# Patient Record
Sex: Male | Born: 1990
Health system: Southern US, Community
[De-identification: ages and names within clinical notes are randomized; demographics above are authoritative.]

## PROBLEM LIST (undated history)

## (undated) DIAGNOSIS — W3301XA Accidental discharge of shotgun, initial encounter: Secondary | ICD-10-CM

## (undated) HISTORY — DX: Accidental discharge of shotgun, initial encounter: W33.01XA

---

## 2011-09-05 ENCOUNTER — Emergency Department (INDEPENDENT_AMBULATORY_CARE_PROVIDER_SITE_OTHER): Payer: 59

## 2011-09-05 ENCOUNTER — Emergency Department (HOSPITAL_COMMUNITY)
Admission: EM | Admit: 2011-09-05 | Discharge: 2011-09-05 | Disposition: A | Discharge status: Home / Self Care | Payer: 59 | Source: Home / Self Care | Attending: Family Medicine | Admitting: Family Medicine

## 2011-09-05 DIAGNOSIS — S93402A Sprain of unspecified ligament of left ankle, initial encounter: Secondary | ICD-10-CM

## 2011-09-05 DIAGNOSIS — S93409A Sprain of unspecified ligament of unspecified ankle, initial encounter: Secondary | ICD-10-CM

## 2011-09-05 MED ORDER — HYDROCODONE-ACETAMINOPHEN 5-325 MG PO TABS: ORAL_TABLET | ORAL | Status: AC

## 2011-09-05 MED ORDER — NAPROXEN 375 MG PO TABS: 375.0000 mg | ORAL_TABLET | Freq: Two times a day (BID) | ORAL | Status: AC

## 2011-09-05 NOTE — ED Provider Notes (Signed)
History     CSN: [id number]  Arrival date & time 09/05/11  1208   First MD Initiated Contact with Patient 09/05/11 1417      Chief Complaint  Patient presents with  . Ankle Pain    (Consider location/radiation/quality/duration/timing/severity/associated sxs/prior treatment) HPI Comments: Colin Thompson presents for evaluation of LEFT ankle pain after twisting it yesterday while playing basketball. He reports severe pain with ambulation.   Patient is a 21 y.o. male presenting with ankle pain. The history is provided by the patient.  Ankle Pain  The incident occurred yesterday. The incident occurred at home. The injury mechanism was torsion. The pain is present in the left ankle. The pain is severe. The pain has been constant since onset. Associated symptoms include inability to bear weight. The symptoms are aggravated by bearing weight, activity and palpation.    History reviewed. No pertinent past medical history.  History reviewed. No pertinent past surgical history.  No family history on file.  History  Substance Use Topics  . Smoking status: Never Smoker   . Smokeless tobacco: Not on file  . Alcohol Use: No      Review of Systems  Constitutional: Negative.   HENT: Negative.   Eyes: Negative.   Respiratory: Negative.   Cardiovascular: Negative.   Gastrointestinal: Negative.   Genitourinary: Negative.   Musculoskeletal: Positive for joint swelling, arthralgias and gait problem.  Skin: Negative.   Neurological: Negative.     Allergies  Review of patient's allergies indicates no known allergies.  Home Medications   Current Outpatient Rx  Name Route Sig Dispense Refill  . HYDROCODONE-ACETAMINOPHEN 5-325 MG PO TABS  Take one to two tablets every 4 to 6 hours as needed for pain 20 tablet 0  . NAPROXEN 375 MG PO TABS Oral Take 1 tablet (375 mg total) by mouth 2 (two) times daily. 20 tablet 0    BP 122/70  Pulse 60  Temp(Src) 98 F (36.7 C) (Oral)  Resp 16  SpO2  100%  Physical Exam  Nursing note and vitals reviewed. Constitutional: He is oriented to person, place, and time. He appears well-developed and well-nourished.  HENT:  Head: Normocephalic and atraumatic.  Eyes: EOM are normal.  Neck: Normal range of motion.  Pulmonary/Chest: Effort normal.  Musculoskeletal: Normal range of motion.       Left ankle: He exhibits swelling. tenderness. Lateral malleolus, AITFL, CF ligament and posterior TFL tenderness found. No medial malleolus, no head of 5th metatarsal and no proximal fibula tenderness found.       Feet:  Neurological: He is alert and oriented to person, place, and time.  Skin: Skin is warm and dry.  Psychiatric: His behavior is normal.    ED Course  Procedures (including critical care time)  Labs Reviewed - No data to display Dg Ankle Complete Left  09/05/2011  *RADIOLOGY REPORT*  Clinical Data: Left ankle injury, pain.  LEFT ANKLE COMPLETE - 3+ VIEW  Comparison: None  Findings: Lateral soft tissue swelling.  No underlying bony abnormality.  No fracture, subluxation or dislocation.  IMPRESSION: Lateral soft tissue swelling.  No fracture.  Original Report Authenticated By: Cyndie Chime, M.D.     1. Left ankle sprain       MDM  LEFT ankle xray: reviewed by radiologist and myself; no acute findings; no fracture        Richardo Priest, MD 09/05/11 1453

## 2011-09-05 NOTE — ED Notes (Signed)
Pt states he was playing basketball yesterday, rolled his lt ankle and fell.  C/o pain, swelling and bruising to lateral aspect of lt ankle.  Pain is worse with weight bearing.

## 2013-05-29 ENCOUNTER — Encounter (HOSPITAL_COMMUNITY): Payer: Self-pay | Admitting: *Deleted

## 2013-05-29 ENCOUNTER — Emergency Department (HOSPITAL_COMMUNITY)
Admission: EM | Admit: 2013-05-29 | Discharge: 2013-05-29 | Disposition: A | Discharge status: Home / Self Care | Payer: 59 | Attending: Emergency Medicine | Admitting: Emergency Medicine

## 2013-05-29 DIAGNOSIS — Z711 Person with feared health complaint in whom no diagnosis is made: Secondary | ICD-10-CM

## 2013-05-29 DIAGNOSIS — R109 Unspecified abdominal pain: Secondary | ICD-10-CM | POA: Insufficient documentation

## 2013-05-29 DIAGNOSIS — Z202 Contact with and (suspected) exposure to infections with a predominantly sexual mode of transmission: Secondary | ICD-10-CM | POA: Insufficient documentation

## 2013-05-29 MED ORDER — LIDOCAINE HCL (PF) 1 % IJ SOLN
INTRAMUSCULAR | Status: AC
  Administered 2013-05-29: 2 mL
  Filled 2013-05-29: qty 5

## 2013-05-29 MED ORDER — CEFTRIAXONE SODIUM 1 G IJ SOLR
1.0000 g | Freq: Once | INTRAMUSCULAR | Status: AC
  Administered 2013-05-29: 1 g via INTRAMUSCULAR
  Filled 2013-05-29: qty 10

## 2013-05-29 MED ORDER — AZITHROMYCIN 250 MG PO TABS
1000.0000 mg | ORAL_TABLET | Freq: Once | ORAL | Status: AC
  Administered 2013-05-29: 1000 mg via ORAL
  Filled 2013-05-29: qty 4

## 2013-05-29 NOTE — ED Notes (Signed)
Pt was told by girlfriend that she has chlamydia. Denies any s/s.
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
## 2013-05-29 NOTE — ED Provider Notes (Signed)
Medical screening examination/treatment/procedure(s) were performed by non-physician practitioner and as supervising physician I was immediately available for consultation/collaboration.    Candyce Churn, MD 05/29/13 [phone]

## 2013-05-30 LAB — GC/CHLAMYDIA PROBE AMP: CT Probe RNA: POSITIVE — AB

## 2013-05-31 ENCOUNTER — Telehealth (HOSPITAL_COMMUNITY): Payer: Self-pay | Admitting: Emergency Medicine

## 2013-05-31 NOTE — ED Notes (Signed)
Patient has +Chlamydia. 

## 2013-05-31 NOTE — ED Notes (Signed)
+  chlamydia. Patient treated with Rocephin and Zithromax. [employer] faxed.

## 2013-06-01 ENCOUNTER — Telehealth (HOSPITAL_COMMUNITY): Payer: Self-pay | Admitting: Emergency Medicine

## 2013-06-04 NOTE — ED Notes (Signed)
Patient informed of positive results after id'd x 2 and informed of need to notify partner to be treated. 

## 2013-08-11 ENCOUNTER — Encounter (HOSPITAL_COMMUNITY)
Admission: EM | Disposition: A | Discharge status: Home / Self Care | Payer: Self-pay | Source: Home / Self Care | Attending: Vascular Surgery

## 2013-08-11 ENCOUNTER — Encounter (HOSPITAL_COMMUNITY): Payer: 59 | Admitting: Anesthesiology

## 2013-08-11 ENCOUNTER — Emergency Department (HOSPITAL_COMMUNITY): Payer: 59

## 2013-08-11 ENCOUNTER — Emergency Department (HOSPITAL_COMMUNITY): Payer: 59 | Admitting: Anesthesiology

## 2013-08-11 ENCOUNTER — Inpatient Hospital Stay (HOSPITAL_COMMUNITY)
Admission: EM | Admit: 2013-08-11 | Discharge: 2013-08-14 | DRG: 908 | Disposition: A | Discharge status: Home / Self Care | Payer: 59 | Attending: Vascular Surgery | Admitting: Vascular Surgery

## 2013-08-11 ENCOUNTER — Encounter (HOSPITAL_COMMUNITY): Payer: Self-pay | Admitting: Emergency Medicine

## 2013-08-11 DIAGNOSIS — Y249XXA Unspecified firearm discharge, undetermined intent, initial encounter: Secondary | ICD-10-CM

## 2013-08-11 DIAGNOSIS — S75009A Unspecified injury of femoral artery, unspecified leg, initial encounter: Secondary | ICD-10-CM

## 2013-08-11 DIAGNOSIS — W3400XA Accidental discharge from unspecified firearms or gun, initial encounter: Secondary | ICD-10-CM

## 2013-08-11 DIAGNOSIS — D72829 Elevated white blood cell count, unspecified: Secondary | ICD-10-CM | POA: Diagnosis not present

## 2013-08-11 DIAGNOSIS — D62 Acute posthemorrhagic anemia: Secondary | ICD-10-CM | POA: Diagnosis not present

## 2013-08-11 DIAGNOSIS — S85009A Unspecified injury of popliteal artery, unspecified leg, initial encounter: Principal | ICD-10-CM | POA: Diagnosis present

## 2013-08-11 DIAGNOSIS — Y92410 Unspecified street and highway as the place of occurrence of the external cause: Secondary | ICD-10-CM

## 2013-08-11 DIAGNOSIS — F17200 Nicotine dependence, unspecified, uncomplicated: Secondary | ICD-10-CM | POA: Diagnosis present

## 2013-08-11 DIAGNOSIS — I743 Embolism and thrombosis of arteries of the lower extremities: Secondary | ICD-10-CM | POA: Diagnosis present

## 2013-08-11 DIAGNOSIS — T79A29A Traumatic compartment syndrome of unspecified lower extremity, initial encounter: Secondary | ICD-10-CM | POA: Diagnosis present

## 2013-08-11 DIAGNOSIS — F1210 Cannabis abuse, uncomplicated: Secondary | ICD-10-CM | POA: Diagnosis present

## 2013-08-11 HISTORY — PX: FEMORAL-POPLITEAL BYPASS GRAFT: SHX937

## 2013-08-11 HISTORY — PX: INTRAOPERATIVE ARTERIOGRAM: SHX5157

## 2013-08-11 LAB — CBC WITH DIFFERENTIAL/PLATELET
Basophils Absolute: 0 10*3/uL (ref 0.0–0.1)
Basophils Relative: 1 % (ref 0–1)
Eosinophils Relative: 2 % (ref 0–5)
HCT: 42.9 % (ref 39.0–52.0)
Hemoglobin: 15 g/dL (ref 13.0–17.0)
MCHC: 35 g/dL (ref 30.0–36.0)
MCV: 90.1 fL (ref 78.0–100.0)
Monocytes Absolute: 0.5 10*3/uL (ref 0.1–1.0)
Monocytes Relative: 6 % (ref 3–12)
Neutro Abs: 3.8 10*3/uL (ref 1.7–7.7)
RDW: 13.3 % (ref 11.5–15.5)

## 2013-08-11 LAB — POCT I-STAT, CHEM 8
BUN: 12 mg/dL (ref 6–23)
Calcium, Ion: 1.2 mmol/L (ref 1.12–1.23)
Creatinine, Ser: 1.2 mg/dL (ref 0.50–1.35)
Hemoglobin: 16.3 g/dL (ref 13.0–17.0)
Sodium: 142 mEq/L (ref 135–145)
TCO2: 27 mmol/L (ref 0–100)

## 2013-08-11 LAB — BASIC METABOLIC PANEL
BUN: 11 mg/dL (ref 6–23)
CO2: 26 mEq/L (ref 19–32)
Calcium: 9 mg/dL (ref 8.4–10.5)
Chloride: 103 mEq/L (ref 96–112)
Creatinine, Ser: 1.1 mg/dL (ref 0.50–1.35)
GFR calc Af Amer: >90 mL/min (ref >90)

## 2013-08-11 SURGERY — BYPASS GRAFT FEMORAL-POPLITEAL ARTERY
Anesthesia: General | Site: Leg Upper | Laterality: Left

## 2013-08-11 MED ORDER — GLYCOPYRROLATE 0.2 MG/ML IJ SOLN
INTRAMUSCULAR | Status: DC | PRN
  Administered 2013-08-11: .6 mg via INTRAVENOUS

## 2013-08-11 MED ORDER — MORPHINE SULFATE (PF) 1 MG/ML IV SOLN
INTRAVENOUS | Status: AC
  Administered 2013-08-12: 1.5 mg
  Filled 2013-08-11: qty 25

## 2013-08-11 MED ORDER — HEPARIN SODIUM (PORCINE) 1000 UNIT/ML IJ SOLN
INTRAMUSCULAR | Status: DC | PRN
  Administered 2013-08-11: 10000 [IU] via INTRAVENOUS
  Administered 2013-08-11: 5000 [IU] via INTRAVENOUS

## 2013-08-11 MED ORDER — ALBUMIN HUMAN 5 % IV SOLN
INTRAVENOUS | Status: DC | PRN
  Administered 2013-08-11: 20:00:00 via INTRAVENOUS

## 2013-08-11 MED ORDER — DEXAMETHASONE SODIUM PHOSPHATE 4 MG/ML IJ SOLN
INTRAMUSCULAR | Status: DC | PRN
  Administered 2013-08-11: 4 mg via INTRAVENOUS

## 2013-08-11 MED ORDER — CEFAZOLIN SODIUM 1-5 GM-% IV SOLN
1.0000 g | Freq: Once | INTRAVENOUS | Status: AC
  Administered 2013-08-11: 1 g via INTRAVENOUS
  Filled 2013-08-11: qty 50

## 2013-08-11 MED ORDER — HYDROMORPHONE HCL PF 1 MG/ML IJ SOLN: 0.2500 mg | INTRAMUSCULAR | Status: DC | PRN

## 2013-08-11 MED ORDER — PROTAMINE SULFATE 10 MG/ML IV SOLN
INTRAVENOUS | Status: DC | PRN
  Administered 2013-08-11: 70 mg via INTRAVENOUS

## 2013-08-11 MED ORDER — PROPOFOL 10 MG/ML IV BOLUS
INTRAVENOUS | Status: DC | PRN
  Administered 2013-08-11: 190 mg via INTRAVENOUS

## 2013-08-11 MED ORDER — ARTIFICIAL TEARS OP OINT
TOPICAL_OINTMENT | OPHTHALMIC | Status: DC | PRN
  Administered 2013-08-11: 1 via OPHTHALMIC

## 2013-08-11 MED ORDER — ONDANSETRON HCL 4 MG/2ML IJ SOLN: 4.0000 mg | Freq: Four times a day (QID) | INTRAMUSCULAR | Status: DC | PRN

## 2013-08-11 MED ORDER — FENTANYL CITRATE 0.05 MG/ML IJ SOLN
INTRAMUSCULAR | Status: DC | PRN
  Administered 2013-08-11 (×12): 50 ug via INTRAVENOUS

## 2013-08-11 MED ORDER — FENTANYL CITRATE 0.05 MG/ML IJ SOLN
50.0000 ug | INTRAMUSCULAR | Status: AC | PRN
  Administered 2013-08-11 (×2): 50 ug via INTRAVENOUS
  Filled 2013-08-11 (×2): qty 2

## 2013-08-11 MED ORDER — OXYCODONE-ACETAMINOPHEN 5-325 MG PO TABS: ORAL_TABLET | ORAL | Status: DC

## 2013-08-11 MED ORDER — ONDANSETRON HCL 4 MG/2ML IJ SOLN
INTRAMUSCULAR | Status: DC | PRN
  Administered 2013-08-11: 4 mg via INTRAVENOUS

## 2013-08-11 MED ORDER — ROCURONIUM BROMIDE 100 MG/10ML IV SOLN
INTRAVENOUS | Status: DC | PRN
  Administered 2013-08-11: 20 mg via INTRAVENOUS
  Administered 2013-08-11: 50 mg via INTRAVENOUS
  Administered 2013-08-11: 20 mg via INTRAVENOUS
  Administered 2013-08-11 (×2): 10 mg via INTRAVENOUS
  Administered 2013-08-11: 20 mg via INTRAVENOUS

## 2013-08-11 MED ORDER — NEOSTIGMINE METHYLSULFATE 1 MG/ML IJ SOLN
INTRAMUSCULAR | Status: DC | PRN
  Administered 2013-08-11: 4 mg via INTRAVENOUS

## 2013-08-11 MED ORDER — LIDOCAINE HCL (CARDIAC) 20 MG/ML IV SOLN
INTRAVENOUS | Status: DC | PRN
  Administered 2013-08-11: 60 mg via INTRAVENOUS

## 2013-08-11 MED ORDER — HYDROMORPHONE HCL PF 1 MG/ML IJ SOLN
1.0000 mg | Freq: Once | INTRAMUSCULAR | Status: AC
  Administered 2013-08-11: 1 mg via INTRAVENOUS
  Filled 2013-08-11: qty 1

## 2013-08-11 MED ORDER — OXYCODONE HCL 5 MG/5ML PO SOLN: 5.0000 mg | Freq: Once | ORAL | Status: AC | PRN

## 2013-08-11 MED ORDER — LACTATED RINGERS IV SOLN
INTRAVENOUS | Status: DC | PRN
  Administered 2013-08-11 (×3): via INTRAVENOUS

## 2013-08-11 MED ORDER — CEFAZOLIN SODIUM-DEXTROSE 2-3 GM-% IV SOLR
INTRAVENOUS | Status: AC
  Filled 2013-08-11: qty 50

## 2013-08-11 MED ORDER — OXYCODONE HCL 5 MG PO TABS: 5.0000 mg | ORAL_TABLET | Freq: Once | ORAL | Status: AC | PRN

## 2013-08-11 MED ORDER — MIDAZOLAM HCL 5 MG/5ML IJ SOLN
INTRAMUSCULAR | Status: DC | PRN
  Administered 2013-08-11: 1 mg via INTRAVENOUS

## 2013-08-11 MED ORDER — IOHEXOL 300 MG/ML  SOLN
INTRAMUSCULAR | Status: DC | PRN
  Administered 2013-08-11: 50 mL via INTRA_ARTERIAL

## 2013-08-11 MED ORDER — LACTATED RINGERS IV SOLN
INTRAVENOUS | Status: DC
  Administered 2013-08-11: 50 mL/h via INTRAVENOUS
  Administered 2013-08-13: 15:00:00 via INTRAVENOUS

## 2013-08-11 MED ORDER — IOHEXOL 300 MG/ML  SOLN
INTRAMUSCULAR | Status: DC | PRN
  Administered 2013-08-11: 25 mL via INTRA_ARTERIAL

## 2013-08-11 MED ORDER — MORPHINE SULFATE (PF) 1 MG/ML IV SOLN
INTRAVENOUS | Status: DC
  Administered 2013-08-11: via INTRAVENOUS
  Administered 2013-08-12: 3 mg via INTRAVENOUS
  Administered 2013-08-12: 7.5 mg via INTRAVENOUS
  Administered 2013-08-12: 12 mg via INTRAVENOUS
  Administered 2013-08-12: 6 mg via INTRAVENOUS
  Administered 2013-08-13: 4.5 mg via INTRAVENOUS
  Administered 2013-08-13: 6 mg via INTRAVENOUS
  Administered 2013-08-13: 9 mg via INTRAVENOUS
  Administered 2013-08-13: 0.843 mg via INTRAVENOUS
  Administered 2013-08-13: 7.5 mg via INTRAVENOUS
  Filled 2013-08-11 (×2): qty 25

## 2013-08-11 MED ORDER — SODIUM CHLORIDE 0.9 % IV SOLN
INTRAVENOUS | Status: DC
  Administered 2013-08-11: 14:00:00 via INTRAVENOUS

## 2013-08-11 MED ORDER — IOHEXOL 350 MG/ML SOLN
100.0000 mL | Freq: Once | INTRAVENOUS | Status: AC | PRN
  Administered 2013-08-11: 100 mL via INTRAVENOUS

## 2013-08-11 MED ORDER — SODIUM CHLORIDE 0.9 % IR SOLN
Status: DC | PRN
  Administered 2013-08-11: 20:00:00

## 2013-08-11 MED ORDER — THROMBIN 20000 UNITS EX SOLR
CUTANEOUS | Status: AC
  Filled 2013-08-11: qty 20000

## 2013-08-11 MED ORDER — CEFAZOLIN SODIUM-DEXTROSE 2-3 GM-% IV SOLR
INTRAVENOUS | Status: DC | PRN
  Administered 2013-08-11: 2 g via INTRAVENOUS

## 2013-08-11 MED ORDER — PAPAVERINE HCL 30 MG/ML IJ SOLN
INTRAMUSCULAR | Status: AC
  Filled 2013-08-11: qty 2

## 2013-08-11 MED ORDER — SUCCINYLCHOLINE CHLORIDE 20 MG/ML IJ SOLN
INTRAMUSCULAR | Status: DC | PRN
  Administered 2013-08-11: 120 mg via INTRAVENOUS

## 2013-08-11 MED ORDER — 0.9 % SODIUM CHLORIDE (POUR BTL) OPTIME
TOPICAL | Status: DC | PRN
  Administered 2013-08-11: 3000 mL

## 2013-08-11 MED ORDER — CEPHALEXIN 500 MG PO CAPS: 500.0000 mg | ORAL_CAPSULE | Freq: Four times a day (QID) | ORAL | Status: DC

## 2013-08-11 MED ORDER — ONDANSETRON HCL 4 MG/2ML IJ SOLN
4.0000 mg | INTRAMUSCULAR | Status: DC | PRN
  Administered 2013-08-11: 4 mg via INTRAVENOUS
  Filled 2013-08-11: qty 2

## 2013-08-11 SURGICAL SUPPLY — 73 items
BANDAGE ELASTIC 4 VELCRO ST LF (GAUZE/BANDAGES/DRESSINGS) ×4 IMPLANT
BANDAGE ESMARK 6X9 LF (GAUZE/BANDAGES/DRESSINGS) IMPLANT
BANDAGE GAUZE ELAST BULKY 4 IN (GAUZE/BANDAGES/DRESSINGS) ×4 IMPLANT
BNDG ESMARK 6X9 LF (GAUZE/BANDAGES/DRESSINGS)
CANISTER SUCTION 2500CC (MISCELLANEOUS) ×2 IMPLANT
CANNULA VESSEL 3MM 2 BLNT TIP (CANNULA) ×2 IMPLANT
CATH EMB 4FR 80CM (CATHETERS) ×2 IMPLANT
CLIP TI MEDIUM 24 (CLIP) ×2 IMPLANT
CLIP TI WIDE RED SMALL 24 (CLIP) ×2 IMPLANT
COVER SURGICAL LIGHT HANDLE (MISCELLANEOUS) ×2 IMPLANT
CUFF TOURNIQUET SINGLE 24IN (TOURNIQUET CUFF) IMPLANT
CUFF TOURNIQUET SINGLE 34IN LL (TOURNIQUET CUFF) IMPLANT
CUFF TOURNIQUET SINGLE 44IN (TOURNIQUET CUFF) IMPLANT
DERMABOND ADVANCED (GAUZE/BANDAGES/DRESSINGS)
DERMABOND ADVANCED .7 DNX12 (GAUZE/BANDAGES/DRESSINGS) IMPLANT
DRAIN HEMOVAC 1/8 X 5 (WOUND CARE) ×2 IMPLANT
DRAIN SNY WOU (WOUND CARE) IMPLANT
DRAPE WARM FLUID 44X44 (DRAPE) ×2 IMPLANT
DRAPE X-RAY CASS 24X20 (DRAPES) ×4 IMPLANT
DRSG COVADERM 4X10 (GAUZE/BANDAGES/DRESSINGS) ×2 IMPLANT
DRSG COVADERM 4X6 (GAUZE/BANDAGES/DRESSINGS) ×2 IMPLANT
ELECT REM PT RETURN 9FT ADLT (ELECTROSURGICAL) ×2
ELECTRODE REM PT RTRN 9FT ADLT (ELECTROSURGICAL) ×1 IMPLANT
EVACUATOR SILICONE 100CC (DRAIN) ×2 IMPLANT
GLOVE BIO SURGEON STRL SZ7.5 (GLOVE) ×2 IMPLANT
GLOVE BIOGEL PI IND STRL 6.5 (GLOVE) ×2 IMPLANT
GLOVE BIOGEL PI IND STRL 7.0 (GLOVE) ×2 IMPLANT
GLOVE BIOGEL PI IND STRL 7.5 (GLOVE) ×2 IMPLANT
GLOVE BIOGEL PI INDICATOR 6.5 (GLOVE) ×2
GLOVE BIOGEL PI INDICATOR 7.0 (GLOVE) ×2
GLOVE BIOGEL PI INDICATOR 7.5 (GLOVE) ×2
GLOVE SS BIOGEL STRL SZ 7 (GLOVE) ×2 IMPLANT
GLOVE SUPERSENSE BIOGEL SZ 7 (GLOVE) ×2
GLOVE SURG SS PI 7.5 STRL IVOR (GLOVE) ×2 IMPLANT
GOWN PREVENTION PLUS XLARGE (GOWN DISPOSABLE) ×6 IMPLANT
GOWN STRL NON-REIN LRG LVL3 (GOWN DISPOSABLE) ×6 IMPLANT
KIT BASIN OR (CUSTOM PROCEDURE TRAY) ×2 IMPLANT
KIT ROOM TURNOVER OR (KITS) ×2 IMPLANT
NS IRRIG 1000ML POUR BTL (IV SOLUTION) ×6 IMPLANT
PACK PERIPHERAL VASCULAR (CUSTOM PROCEDURE TRAY) ×2 IMPLANT
PAD ABD 8X10 STRL (GAUZE/BANDAGES/DRESSINGS) ×4 IMPLANT
PAD ARMBOARD 7.5X6 YLW CONV (MISCELLANEOUS) ×4 IMPLANT
PADDING CAST COTTON 6X4 STRL (CAST SUPPLIES) IMPLANT
SET COLLECT BLD 21X3/4 12 (NEEDLE) ×4 IMPLANT
SET MICROPUNCTURE 5F STIFF (MISCELLANEOUS) ×2 IMPLANT
SPONGE GAUZE 4X4 12PLY (GAUZE/BANDAGES/DRESSINGS) ×8 IMPLANT
SPONGE LAP 18X18 X RAY DECT (DISPOSABLE) ×2 IMPLANT
SPONGE SURGIFOAM ABS GEL 100 (HEMOSTASIS) IMPLANT
STAPLER VISISTAT 35W (STAPLE) ×4 IMPLANT
STOPCOCK 4 WAY LG BORE MALE ST (IV SETS) ×2 IMPLANT
SUT ETHILON 3 0 PS 1 (SUTURE) ×2 IMPLANT
SUT PROLENE 5 0 C 1 24 (SUTURE) IMPLANT
SUT PROLENE 6 0 CC (SUTURE) ×8 IMPLANT
SUT PROLENE 7 0 BV 1 (SUTURE) ×2 IMPLANT
SUT PROLENE 7 0 BV1 MDA (SUTURE) IMPLANT
SUT SILK 2 0 SH (SUTURE) ×2 IMPLANT
SUT SILK 3 0 (SUTURE) ×1
SUT SILK 3-0 18XBRD TIE 12 (SUTURE) ×1 IMPLANT
SUT SILK 4 0 (SUTURE) ×1
SUT SILK 4-0 18XBRD TIE 12 (SUTURE) ×1 IMPLANT
SUT VIC AB 2-0 CTX 36 (SUTURE) ×2 IMPLANT
SUT VIC AB 3-0 SH 27 (SUTURE) ×2
SUT VIC AB 3-0 SH 27X BRD (SUTURE) ×2 IMPLANT
SUT VIC AB 4-0 PS2 27 (SUTURE) ×4 IMPLANT
SYR 3ML LL SCALE MARK (SYRINGE) ×2 IMPLANT
TAPE CLOTH SURG 4X10 WHT LF (GAUZE/BANDAGES/DRESSINGS) ×4 IMPLANT
TAPE UMBILICAL COTTON 1/8X30 (MISCELLANEOUS) IMPLANT
TOWEL OR 17X24 6PK STRL BLUE (TOWEL DISPOSABLE) ×6 IMPLANT
TOWEL OR 17X26 10 PK STRL BLUE (TOWEL DISPOSABLE) ×4 IMPLANT
TRAY FOLEY CATH 16FRSI W/METER (SET/KITS/TRAYS/PACK) ×2 IMPLANT
TUBING EXTENTION W/L.L. (IV SETS) ×2 IMPLANT
UNDERPAD 30X30 INCONTINENT (UNDERPADS AND DIAPERS) ×2 IMPLANT
WATER STERILE IRR 1000ML POUR (IV SOLUTION) ×2 IMPLANT
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
## 2013-08-11 NOTE — Preoperative (Addendum)
Beta Blockers   Reason not to administer Beta Blockers:Not Applicable 
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
## 2013-08-11 NOTE — Anesthesia Postprocedure Evaluation (Signed)
Anesthesia Post Note  Patient: Colin Thompson  Procedure(s) Performed: Procedure(s) (LRB): Repair of Left Popliteal Artery using Left Above Knee Popliteal to Above Knee Popliteal Bypass Graft with interpositional contralateral reverse vein graft; fourth compartment fasciotomy (Left) INTRA OPERATIVE ARTERIOGRAM (Left)  Anesthesia type: General  Patient location: PACU  Post pain: Pain level controlled and Adequate analgesia  Post assessment: Post-op Vital signs reviewed, Patient's Cardiovascular Status Stable, Respiratory Function Stable, Patent Airway and Pain level controlled  Last Vitals:  Filed Vitals:   08/11/13 2330  BP:   Pulse:   Temp:   Resp: 14    Post vital signs: Reviewed and stable  Level of consciousness: awake, alert  and oriented  Complications: No apparent anesthesia complications

## 2013-08-11 NOTE — ED Notes (Signed)
RN transported pt over to radiology.

## 2013-08-11 NOTE — Transfer of Care (Signed)
Immediate Anesthesia Transfer of Care Note  Patient: Jacorian Xxxparker  Procedure(s) Performed: Procedure(s): Repair of Left Popliteal Artery using Left Above Knee Popliteal to Above Knee Popliteal Bypass Graft with interpositional contralateral reverse vein graft; fourth compartment fasciotomy (Left) INTRA OPERATIVE ARTERIOGRAM (Left)  Patient Location: PACU  Anesthesia Type:General  Level of Consciousness: awake, alert , oriented and patient cooperative  Airway & Oxygen Therapy: Patient Spontanous Breathing and Patient connected to nasal cannula oxygen  Post-op Assessment: Report given to PACU RN and Post -op Vital signs reviewed and stable  Post vital signs: Reviewed and stable  Complications: No apparent anesthesia complications
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
## 2013-08-11 NOTE — Anesthesia Procedure Notes (Signed)
Procedure Name: Intubation Date/Time: 08/11/2013 7:02 PM Performed by: Julianne Rice Z Pre-anesthesia Checklist: Patient identified, Timeout performed, Emergency Drugs available, Suction available and Patient being monitored Patient Re-evaluated:Patient Re-evaluated prior to inductionOxygen Delivery Method: Circle system utilized Preoxygenation: Pre-oxygenation with 100% oxygen Intubation Type: IV induction, Rapid sequence and Cricoid Pressure applied Laryngoscope Size: Mac and 4 Grade View: Grade I Tube type: Oral Tube size: 8.0 mm Number of attempts: 1 Airway Equipment and Method: Stylet Placement Confirmation: ETT inserted through vocal cords under direct vision,  breath sounds checked- equal and bilateral and positive ETCO2 Secured at: 23 cm Tube secured with: Tape Dental Injury: Teeth and Oropharynx as per pre-operative assessment

## 2013-08-11 NOTE — ED Notes (Signed)
Phlebotomy at bedside to draw PT/ INR then will take pt up to OR.
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
## 2013-08-11 NOTE — ED Notes (Addendum)
Pt signed consent form. Undressed and in gown. All belongings given to girlfriend.

## 2013-08-11 NOTE — ED Notes (Signed)
Pt reports tetanus shot is up to date.

## 2013-08-12 ENCOUNTER — Encounter (HOSPITAL_COMMUNITY): Payer: Self-pay | Admitting: Vascular Surgery

## 2013-08-12 ENCOUNTER — Other Ambulatory Visit: Payer: Self-pay | Admitting: *Deleted

## 2013-08-12 LAB — GLUCOSE, CAPILLARY: Glucose-Capillary: 123 mg/dL — ABNORMAL HIGH (ref 70–99)

## 2013-08-12 LAB — CBC
HCT: 33.7 % — ABNORMAL LOW (ref 39.0–52.0)
MCH: 31.3 pg (ref 26.0–34.0)
MCV: 90.8 fL (ref 78.0–100.0)
RDW: 13.2 % (ref 11.5–15.5)
WBC: 15 10*3/uL — ABNORMAL HIGH (ref 4.0–10.5)

## 2013-08-12 LAB — BASIC METABOLIC PANEL
BUN: 9 mg/dL (ref 6–23)
CO2: 24 mEq/L (ref 19–32)
Calcium: 8.7 mg/dL (ref 8.4–10.5)
Chloride: 101 mEq/L (ref 96–112)
Creatinine, Ser: 0.97 mg/dL (ref 0.50–1.35)
GFR calc Af Amer: >90 mL/min (ref >90)
GFR calc non Af Amer: >90 mL/min (ref >90)

## 2013-08-12 MED ORDER — DEXTROSE 5 % IV SOLN
1.5000 g | Freq: Two times a day (BID) | INTRAVENOUS | Status: AC
  Administered 2013-08-12 (×2): 1.5 g via INTRAVENOUS
  Filled 2013-08-12 (×2): qty 1.5

## 2013-08-12 MED ORDER — SODIUM CHLORIDE 0.9 % IJ SOLN: 9.0000 mL | INTRAMUSCULAR | Status: DC | PRN

## 2013-08-12 MED ORDER — INFLUENZA VAC SPLIT QUAD 0.5 ML IM SUSP
0.5000 mL | INTRAMUSCULAR | Status: DC
  Filled 2013-08-12: qty 0.5

## 2013-08-12 MED ORDER — FLEET ENEMA 7-19 GM/118ML RE ENEM: 1.0000 | ENEMA | Freq: Once | RECTAL | Status: AC | PRN

## 2013-08-12 MED ORDER — ALUM & MAG HYDROXIDE-SIMETH 200-200-20 MG/5ML PO SUSP: 15.0000 mL | ORAL | Status: DC | PRN

## 2013-08-12 MED ORDER — LABETALOL HCL 5 MG/ML IV SOLN: 10.0000 mg | INTRAVENOUS | Status: DC | PRN

## 2013-08-12 MED ORDER — MAGNESIUM SULFATE 40 MG/ML IJ SOLN
2.0000 g | Freq: Once | INTRAMUSCULAR | Status: AC | PRN
  Filled 2013-08-12: qty 50

## 2013-08-12 MED ORDER — ACETAMINOPHEN 650 MG RE SUPP: 325.0000 mg | RECTAL | Status: DC | PRN

## 2013-08-12 MED ORDER — METOPROLOL TARTRATE 1 MG/ML IV SOLN: 2.0000 mg | INTRAVENOUS | Status: DC | PRN

## 2013-08-12 MED ORDER — PHENOL 1.4 % MT LIQD: 1.0000 | OROMUCOSAL | Status: DC | PRN

## 2013-08-12 MED ORDER — GUAIFENESIN-DM 100-10 MG/5ML PO SYRP: 15.0000 mL | ORAL_SOLUTION | ORAL | Status: DC | PRN

## 2013-08-12 MED ORDER — DOPAMINE-DEXTROSE 3.2-5 MG/ML-% IV SOLN: 3.0000 ug/kg/min | INTRAVENOUS | Status: DC

## 2013-08-12 MED ORDER — DIPHENHYDRAMINE HCL 50 MG/ML IJ SOLN: 12.5000 mg | Freq: Four times a day (QID) | INTRAMUSCULAR | Status: DC | PRN

## 2013-08-12 MED ORDER — ONDANSETRON HCL 4 MG/2ML IJ SOLN: 4.0000 mg | Freq: Four times a day (QID) | INTRAMUSCULAR | Status: DC | PRN

## 2013-08-12 MED ORDER — ACETAMINOPHEN 325 MG PO TABS: 325.0000 mg | ORAL_TABLET | ORAL | Status: DC | PRN

## 2013-08-12 MED ORDER — SODIUM CHLORIDE 0.9 % IV SOLN
INTRAVENOUS | Status: DC
  Administered 2013-08-12: 20 mL/h via INTRAVENOUS
  Administered 2013-08-12: 01:00:00 via INTRAVENOUS

## 2013-08-12 MED ORDER — DOCUSATE SODIUM 100 MG PO CAPS
100.0000 mg | ORAL_CAPSULE | Freq: Every day | ORAL | Status: DC
  Administered 2013-08-12 – 2013-08-14 (×3): 100 mg via ORAL
  Filled 2013-08-12 (×3): qty 1

## 2013-08-12 MED ORDER — SENNOSIDES-DOCUSATE SODIUM 8.6-50 MG PO TABS
1.0000 | ORAL_TABLET | Freq: Every evening | ORAL | Status: DC | PRN
  Filled 2013-08-12: qty 1

## 2013-08-12 MED ORDER — DIPHENHYDRAMINE HCL 12.5 MG/5ML PO ELIX
12.5000 mg | ORAL_SOLUTION | Freq: Four times a day (QID) | ORAL | Status: DC | PRN
  Filled 2013-08-12: qty 5

## 2013-08-12 MED ORDER — SODIUM CHLORIDE 0.9 % IV SOLN
INTRAVENOUS | Status: DC
  Administered 2013-08-12: 20 mL/h via INTRAVENOUS

## 2013-08-12 MED ORDER — BISACODYL 10 MG RE SUPP: 10.0000 mg | Freq: Every day | RECTAL | Status: DC | PRN

## 2013-08-12 MED ORDER — DEXTROSE 5 % IV SOLN
1.5000 g | INTRAVENOUS | Status: AC
  Administered 2013-08-13: 1.5 g via INTRAVENOUS
  Filled 2013-08-12: qty 1.5

## 2013-08-12 MED ORDER — OXYCODONE-ACETAMINOPHEN 5-325 MG PO TABS: 1.0000 | ORAL_TABLET | ORAL | Status: DC | PRN

## 2013-08-12 MED ORDER — PANTOPRAZOLE SODIUM 40 MG PO TBEC
40.0000 mg | DELAYED_RELEASE_TABLET | Freq: Every day | ORAL | Status: DC
  Administered 2013-08-12 – 2013-08-14 (×3): 40 mg via ORAL
  Filled 2013-08-12 (×3): qty 1

## 2013-08-12 MED ORDER — PNEUMOCOCCAL VAC POLYVALENT 25 MCG/0.5ML IJ INJ
0.5000 mL | INJECTION | INTRAMUSCULAR | Status: DC
  Filled 2013-08-12: qty 0.5

## 2013-08-12 MED ORDER — NALOXONE HCL 0.4 MG/ML IJ SOLN: 0.4000 mg | INTRAMUSCULAR | Status: DC | PRN

## 2013-08-12 MED ORDER — POTASSIUM CHLORIDE CRYS ER 20 MEQ PO TBCR: 20.0000 meq | EXTENDED_RELEASE_TABLET | Freq: Once | ORAL | Status: AC | PRN

## 2013-08-12 MED ORDER — HYDRALAZINE HCL 20 MG/ML IJ SOLN: 10.0000 mg | INTRAMUSCULAR | Status: DC | PRN

## 2013-08-12 MED ORDER — SODIUM CHLORIDE 0.9 % IV SOLN: 500.0000 mL | Freq: Once | INTRAVENOUS | Status: AC | PRN
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
## 2013-08-12 NOTE — Progress Notes (Signed)
Patient admitted from PACU on bed by PACU RN. Oriented patient to unit and room, instructed on callbell and placed at side. Family at bedside, educated on patient's XXX status and set up password. Will continue to monitor.
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
## 2013-08-12 NOTE — Evaluation (Signed)
Physical Therapy Evaluation Patient Details Name: Colin Thompson MRN: [id number] DOB: Jan 23, 1991 Today's Date: 08/12/2013 Time: 1003-1030 PT Time Calculation (min): 27 min  PT Assessment / Plan / Recommendation History of Present Illness  Repair of left above-knee popliteal artery with contralateral reversed greater saphenous vein interposition graft, thrombectomy of left popliteal artery, 4 compartment fasciotomy  Clinical Impression  Pt with L LE pain however tolerated ambulation well. Acute PT to con't to follow for crutch and stair traning. Anticipate pt to be safe for d/c home with mother when medically stable.    PT Assessment  Patient needs continued PT services    Follow Up Recommendations  Outpatient PT;Supervision - Intermittent (when approved by MD)    Does the patient have the potential to tolerate intense rehabilitation      Barriers to Discharge        Equipment Recommendations  Crutches    Recommendations for Other Services     Frequency Min 4X/week    Precautions / Restrictions Precautions Precautions: Fall Restrictions Weight Bearing Restrictions: Yes LLE Weight Bearing: Weight bearing as tolerated   Pertinent Vitals/Pain 8/10 L pain, pt pushed pain pump button x2      Mobility  Bed Mobility Bed Mobility: Supine to Sit;Sitting - Scoot to Edge of Bed Supine to Sit: 4: Min assist;HOB elevated Sitting - Scoot to [place] of Bed: 4: Min assist Details for Bed Mobility Assistance: assist for L LE management Transfers Transfers: Sit to Stand;Stand to Sit Sit to Stand: With upper extremity assist;From bed;3: Mod assist Stand to Sit: 4: Min assist;With upper extremity assist;To chair/3-in-1 Details for Transfer Assistance: v/c's for hand placement, assist to initiate clearance from bed Ambulation/Gait Ambulation/Gait Assistance: 4: Min assist Ambulation Distance (Feet): 120 Feet Assistive device: Rolling walker Ambulation/Gait Assistance Details: max  v/c's for sequencing of walker, pt initially NWB on L LE then progressed with v/c's to approx <50% L LE WBing Gait Pattern: Step-to pattern;Decreased step length - left;Decreased stance time - left Gait velocity: slow General Gait Details: HR in 130s, RN present and aware Stairs: No    Exercises General Exercises - Lower Extremity Ankle Circles/Pumps: AROM;Left;10 reps;Supine Quad Sets: AROM;5 reps;Left;Supine   PT Diagnosis: Difficulty walking;Acute pain  PT Problem List: Decreased strength;Decreased range of motion;Decreased activity tolerance;Decreased mobility PT Treatment Interventions: DME instruction;Gait training;Stair training;Functional mobility training;Therapeutic activities;Therapeutic exercise;Balance training     PT Goals(Current goals can be found in the care plan section) Acute Rehab PT Goals Patient Stated Goal: nhome PT Goal Formulation: With patient Time For Goal Achievement: 08/19/13 Potential to Achieve Goals: Good  Visit Information  Last PT Received On: 08/12/13 Assistance Needed: +1 History of Present Illness: Repair of left above-knee popliteal artery with contralateral reversed greater saphenous vein interposition graft, thrombectomy of left popliteal artery, 4 compartment fasciotomy       Prior Functioning  Home Living Family/patient expects to be discharged to:: Private residence Living Arrangements: Parent (going home with mother) Available Help at Discharge: Family;Friend(s);Available 24 hours/day Type of Home: House Home Access: Level entry Home Layout: Two level Alternate Level Stairs-Number of Steps: 12 Alternate Level Stairs-Rails: Right Home Equipment: None Prior Function Level of Independence: Independent Communication Communication: No difficulties Dominant Hand: Right    Cognition  Cognition Arousal/Alertness: Awake/alert Behavior During Therapy: WFL for tasks assessed/performed Overall Cognitive Status: Within Functional Limits  for tasks assessed    Extremity/Trunk Assessment Upper Extremity Assessment Upper Extremity Assessment: Generalized weakness Lower Extremity Assessment Lower Extremity Assessment: LLE deficits/detail LLE Deficits /  Details: limited AROM at ankle and knee due to onset of pain, able to initiate quad set Cervical / Trunk Assessment Cervical / Trunk Assessment: Normal   Balance    End of Session PT - End of Session Equipment Utilized During Treatment: Gait belt Activity Tolerance: Patient tolerated treatment well Patient left: in chair;with call bell/phone within reach;with family/visitor present Nurse Communication: Mobility status  GP     Marcene Brawn 08/12/2013, 11:01 AM  Lewis Shock, PT, DPT Pager #: [phone] Office #: [phone]

## 2013-08-13 ENCOUNTER — Inpatient Hospital Stay (HOSPITAL_COMMUNITY): Payer: 59 | Admitting: Anesthesiology

## 2013-08-13 ENCOUNTER — Encounter (HOSPITAL_COMMUNITY): Payer: 59 | Admitting: Anesthesiology

## 2013-08-13 ENCOUNTER — Encounter (HOSPITAL_COMMUNITY)
Admission: EM | Disposition: A | Discharge status: Home / Self Care | Payer: Self-pay | Source: Home / Self Care | Attending: Vascular Surgery

## 2013-08-13 DIAGNOSIS — S75009A Unspecified injury of femoral artery, unspecified leg, initial encounter: Secondary | ICD-10-CM

## 2013-08-13 HISTORY — PX: FASCIOTOMY CLOSURE: SHX5829

## 2013-08-13 LAB — CBC
HCT: 25.4 % — ABNORMAL LOW (ref 39.0–52.0)
Platelets: 143 10*3/uL — ABNORMAL LOW (ref 150–400)
RBC: 2.79 MIL/uL — ABNORMAL LOW (ref 4.22–5.81)
RDW: 13.2 % (ref 11.5–15.5)
WBC: 10.1 10*3/uL (ref 4.0–10.5)

## 2013-08-13 SURGERY — FASCIOTOMY CLOSURE
Anesthesia: General | Site: Leg Upper | Laterality: Left

## 2013-08-13 MED ORDER — 0.9 % SODIUM CHLORIDE (POUR BTL) OPTIME
TOPICAL | Status: DC | PRN
  Administered 2013-08-13: 4000 mL

## 2013-08-13 MED ORDER — FENTANYL CITRATE 0.05 MG/ML IJ SOLN
INTRAMUSCULAR | Status: DC | PRN
  Administered 2013-08-13: 50 ug via INTRAVENOUS
  Administered 2013-08-13: 100 ug via INTRAVENOUS

## 2013-08-13 MED ORDER — HYDROMORPHONE HCL PF 1 MG/ML IJ SOLN: 0.2500 mg | INTRAMUSCULAR | Status: DC | PRN

## 2013-08-13 MED ORDER — MIDAZOLAM HCL 5 MG/5ML IJ SOLN
INTRAMUSCULAR | Status: DC | PRN
  Administered 2013-08-13: 2 mg via INTRAVENOUS

## 2013-08-13 MED ORDER — ARTIFICIAL TEARS OP OINT
TOPICAL_OINTMENT | OPHTHALMIC | Status: DC | PRN
  Administered 2013-08-13: 1 via OPHTHALMIC

## 2013-08-13 MED ORDER — ONDANSETRON HCL 4 MG/2ML IJ SOLN
INTRAMUSCULAR | Status: DC | PRN
  Administered 2013-08-13: 4 mg via INTRAVENOUS

## 2013-08-13 MED ORDER — INFLUENZA VAC SPLIT QUAD 0.5 ML IM SUSP
0.5000 mL | INTRAMUSCULAR | Status: DC
  Filled 2013-08-13: qty 0.5

## 2013-08-13 MED ORDER — PROPOFOL 10 MG/ML IV BOLUS
INTRAVENOUS | Status: DC | PRN
  Administered 2013-08-13: 50 mg via INTRAVENOUS
  Administered 2013-08-13: 150 mg via INTRAVENOUS

## 2013-08-13 MED ORDER — LIDOCAINE HCL (CARDIAC) 20 MG/ML IV SOLN
INTRAVENOUS | Status: DC | PRN
  Administered 2013-08-13: 40 mg via INTRAVENOUS

## 2013-08-13 MED ORDER — CEFUROXIME SODIUM 1.5 G IJ SOLR: 1.5000 g | Freq: Three times a day (TID) | INTRAMUSCULAR | Status: DC

## 2013-08-13 MED ORDER — DEXTROSE 5 % IV SOLN
1.5000 g | Freq: Three times a day (TID) | INTRAVENOUS | Status: DC
  Administered 2013-08-13 – 2013-08-14 (×2): 1.5 g via INTRAVENOUS
  Filled 2013-08-13 (×5): qty 1.5

## 2013-08-13 MED ORDER — OXYCODONE HCL 5 MG/5ML PO SOLN: 5.0000 mg | Freq: Once | ORAL | Status: DC | PRN

## 2013-08-13 MED ORDER — OXYCODONE HCL 5 MG PO TABS: 5.0000 mg | ORAL_TABLET | Freq: Once | ORAL | Status: DC | PRN

## 2013-08-13 MED ORDER — LACTATED RINGERS IV SOLN
INTRAVENOUS | Status: DC
  Administered 2013-08-13: 14:00:00 via INTRAVENOUS

## 2013-08-13 MED ORDER — DEXTROSE 5 % IV SOLN
INTRAVENOUS | Status: AC
  Filled 2013-08-13: qty 1.5

## 2013-08-13 MED ORDER — DEXTROSE 5 % IV SOLN
1.5000 g | INTRAVENOUS | Status: DC | PRN
  Administered 2013-08-13: 1.5 g via INTRAVENOUS

## 2013-08-13 MED ORDER — MORPHINE SULFATE 2 MG/ML IJ SOLN: 2.0000 mg | INTRAMUSCULAR | Status: DC | PRN

## 2013-08-13 SURGICAL SUPPLY — 33 items
BAG ISOLATION DRAPE 18X18 (DRAPES) ×1 IMPLANT
BANDAGE ELASTIC 4 VELCRO ST LF (GAUZE/BANDAGES/DRESSINGS) IMPLANT
BANDAGE ELASTIC 6 VELCRO ST LF (GAUZE/BANDAGES/DRESSINGS) IMPLANT
BANDAGE GAUZE ELAST BULKY 4 IN (GAUZE/BANDAGES/DRESSINGS) IMPLANT
CANISTER SUCTION 2500CC (MISCELLANEOUS) ×4 IMPLANT
COVER SURGICAL LIGHT HANDLE (MISCELLANEOUS) ×2 IMPLANT
DRAPE ISOLATION BAG 18X18 (DRAPES) ×1
DRSG COVADERM 4X10 (GAUZE/BANDAGES/DRESSINGS) ×4 IMPLANT
DRSG COVADERM 4X8 (GAUZE/BANDAGES/DRESSINGS) ×4 IMPLANT
ELECT REM PT RETURN 9FT ADLT (ELECTROSURGICAL) ×2
ELECTRODE REM PT RTRN 9FT ADLT (ELECTROSURGICAL) ×1 IMPLANT
GLOVE BIO SURGEON STRL SZ 6.5 (GLOVE) ×2 IMPLANT
GLOVE BIO SURGEON STRL SZ7.5 (GLOVE) ×2 IMPLANT
GLOVE BIOGEL PI IND STRL 6.5 (GLOVE) ×1 IMPLANT
GLOVE BIOGEL PI INDICATOR 6.5 (GLOVE) ×1
GOWN PREVENTION PLUS XLARGE (GOWN DISPOSABLE) ×2 IMPLANT
GOWN STRL NON-REIN LRG LVL3 (GOWN DISPOSABLE) ×2 IMPLANT
KIT BASIN OR (CUSTOM PROCEDURE TRAY) ×2 IMPLANT
KIT ROOM TURNOVER OR (KITS) ×2 IMPLANT
NS IRRIG 1000ML POUR BTL (IV SOLUTION) ×8 IMPLANT
PACK GENERAL/GYN (CUSTOM PROCEDURE TRAY) ×2 IMPLANT
PACK UNIVERSAL I (CUSTOM PROCEDURE TRAY) ×2 IMPLANT
PAD ARMBOARD 7.5X6 YLW CONV (MISCELLANEOUS) ×4 IMPLANT
SPONGE GAUZE 4X4 12PLY (GAUZE/BANDAGES/DRESSINGS) ×2 IMPLANT
STAPLER VISISTAT 35W (STAPLE) ×4 IMPLANT
SUT ETHILON 3 0 PS 1 (SUTURE) IMPLANT
SUT VIC AB 2-0 CTX 36 (SUTURE) ×2 IMPLANT
SUT VIC AB 3-0 SH 27 (SUTURE) ×1
SUT VIC AB 3-0 SH 27X BRD (SUTURE) ×1 IMPLANT
SUT VICRYL 4-0 PS2 18IN ABS (SUTURE) ×2 IMPLANT
TOWEL OR 17X24 6PK STRL BLUE (TOWEL DISPOSABLE) ×2 IMPLANT
TOWEL OR 17X26 10 PK STRL BLUE (TOWEL DISPOSABLE) ×2 IMPLANT
WATER STERILE IRR 1000ML POUR (IV SOLUTION) ×2 IMPLANT

## 2013-08-13 NOTE — Anesthesia Procedure Notes (Signed)
Procedure Name: LMA Insertion Date/Time: 08/13/2013 3:04 PM Performed by: Jefm Miles E Pre-anesthesia Checklist: Patient identified, Emergency Drugs available, Suction available and Patient being monitored Patient Re-evaluated:Patient Re-evaluated prior to inductionOxygen Delivery Method: Circle system utilized Preoxygenation: Pre-oxygenation with 100% oxygen Intubation Type: IV induction Ventilation: Mask ventilation without difficulty LMA: LMA inserted LMA Size: 4.0 Number of attempts: 1 Placement Confirmation: positive ETCO2 and breath sounds checked- equal and bilateral Tube secured with: Tape Dental Injury: Teeth and Oropharynx as per pre-operative assessment

## 2013-08-13 NOTE — Progress Notes (Signed)
Physical Therapy Treatment Patient Details Name: Colin Thompson MRN: [id number] DOB: 03-04-1991 Today's Date: 08/13/2013 Time: 4098-1191 PT Time Calculation (min): 32 min  PT Assessment / Plan / Recommendation  History of Present Illness Repair of left above-knee popliteal artery with contralateral reversed greater saphenous vein interposition graft, thrombectomy of left popliteal artery, 4 compartment fasciotomy (for closure of fasciotomy later today 08/13/13)   PT Comments   Patient continues to be limited with weight tolerance left LE.  Demonstrates decreased safety and increased pain at this time with crutch use.  Will need continued training following wound closure this pm.  Likely needs walker for home and will get crutches due to lives on second level of home and needs to do multiple steps.  Follow Up Recommendations  Outpatient PT;Supervision - Intermittent     Does the patient have the potential to tolerate intense rehabilitation   N/A  Barriers to Discharge  None      Equipment Recommendations  Crutches;Rolling walker with 5" wheels    Recommendations for Other Services  None  Frequency Min 4X/week   Progress towards PT Goals Progress towards PT goals: Progressing toward goals  Plan Current plan remains appropriate    Precautions / Restrictions Precautions Precautions: Fall Restrictions Weight Bearing Restrictions: Yes LLE Weight Bearing: Weight bearing as tolerated   Pertinent Vitals/Pain C/o severe left LE pain with dependency and walking     Mobility  Bed Mobility Bed Mobility: Sit to Supine Supine to Sit: 4: Min assist;HOB elevated Sit to Supine: 4: Min assist Details for Bed Mobility Assistance: assist for L LE management Transfers Sit to Stand: With upper extremity assist;From bed;3: Mod assist Stand to Sit: With upper extremity assist;To bed;4: Min assist Details for Transfer Assistance: cues for safety with hand placement and crutch use (demo given  prior to practice) Ambulation/Gait Ambulation/Gait Assistance: 4: Min assist Ambulation Distance (Feet): 80 Feet Assistive device: Crutches Ambulation/Gait Assistance Details: assist for safety with crutches and pt NWB with entire distance today.  c/o increased pain with crutches as opposed to yesterday with walker Gait Pattern: Step-to pattern    Exercises General Exercises - Lower Extremity Ankle Circles/Pumps: AAROM;10 reps;Left    PT Goals (current goals can now be found in the care plan section) Acute Rehab PT Goals Patient Stated Goal: pain free L LE  Visit Information  Last PT Received On: 08/13/13 Assistance Needed: +1 (but +2 for lines if using crutches) History of Present Illness: Repair of left above-knee popliteal artery with contralateral reversed greater saphenous vein interposition graft, thrombectomy of left popliteal artery, 4 compartment fasciotomy (for closure of fasciotomy later today 08/13/13)    Subjective Data  Patient Stated Goal: pain free L LE   Cognition  Cognition Arousal/Alertness: Awake/alert Behavior During Therapy: WFL for tasks assessed/performed Overall Cognitive Status: Within Functional Limits for tasks assessed    Balance  Balance Balance Assessed: Yes Static Standing Balance Static Standing - Balance Support: Bilateral upper extremity supported;During functional activity Static Standing - Level of Assistance: 4: Min assist Static Standing - Comment/# of Minutes: leaning back at times due to unstable on crutches  End of Session PT - End of Session Equipment Utilized During Treatment: Gait belt Activity Tolerance: Patient limited by pain Patient left: in bed;with call bell/phone within reach   GP     [facility] 08/13/2013, 11:43 AM Sheran Lawless, PT [phone] 08/13/2013

## 2013-08-13 NOTE — Evaluation (Signed)
Occupational Therapy Evaluation Patient Details Name: Colin Thompson MRN: [id number] DOB: 1991-02-12 Today's Date: 08/13/2013 Time: 8413-2440 OT Time Calculation (min): 21 min  OT Assessment / Plan / Recommendation History of present illness Repair of left above-knee popliteal artery with contralateral reversed greater saphenous vein interposition graft, thrombectomy of left popliteal artery, 4 compartment fasciotomy   Clinical Impression   Pt presents with L LE pain and balance deficits due to inability to tolerate weight bearing on L LE interfering with ADL and ADL transfers.  Plan is for closure of fasciotomy today.  Will follow acutely.  Do not anticipate pt will need OT upon d/c.    OT Assessment  Patient needs continued OT Services    Follow Up Recommendations  No OT follow up    Barriers to Discharge      Equipment Recommendations  3 in 1 bedside comode    Recommendations for Other Services    Frequency  Min 2X/week    Precautions / Restrictions Precautions Precautions: Fall Restrictions Weight Bearing Restrictions: Yes LLE Weight Bearing: Weight bearing as tolerated   Pertinent Vitals/Pain VSS, L LE pain, did not rate, PCA    ADL  Eating/Feeding: NPO (for sx, but independent) Where Assessed - Eating/Feeding: Bed level Grooming: Wash/dry hands;Wash/dry face;Teeth care;Set up Where Assessed - Grooming: Supine, head of bed up Upper Body Bathing: Set up Where Assessed - Upper Body Bathing: Unsupported sitting Lower Body Bathing: Moderate assistance Where Assessed - Lower Body Bathing: Supine, head of bed up Upper Body Dressing: Set up Where Assessed - Upper Body Dressing: Unsupported sitting Lower Body Dressing: Moderate assistance Where Assessed - Lower Body Dressing: Supine, head of bed up Transfers/Ambulation Related to ADLs: Pt had just returned to bed after PT, not assessed. ADL Comments: Educated pt in multiple uses of 3 in1 and to dress L LE first and  undress it last.    OT Diagnosis: Generalized weakness;Acute pain  OT Problem List: Decreased range of motion;Decreased activity tolerance;Impaired balance (sitting and/or standing);Decreased knowledge of use of DME or AE;Pain OT Treatment Interventions: Self-care/ADL training;Therapeutic activities;DME and/or AE instruction;Patient/family education   OT Goals(Current goals can be found in the care plan section) Acute Rehab OT Goals Patient Stated Goal: pain free L LE OT Goal Formulation: With patient Time For Goal Achievement: 08/20/13 Potential to Achieve Goals: Good ADL Goals Pt Will Perform Grooming: with modified independence;standing Pt Will Perform Lower Body Bathing: with modified independence;sit to/from stand Pt Will Perform Lower Body Dressing: with modified independence;sit to/from stand Pt Will Transfer to Toilet: with modified independence;ambulating (3 in1 over toilet as needed) Pt Will Perform Toileting - Clothing Manipulation and hygiene: with modified independence;sit to/from stand Pt Will Perform Tub/Shower Transfer: Shower transfer;with min guard assist;ambulating;3 in 1  Visit Information  Last OT Received On: 08/13/13 Assistance Needed: +1 History of Present Illness: Repair of left above-knee popliteal artery with contralateral reversed greater saphenous vein interposition graft, thrombectomy of left popliteal artery, 4 compartment fasciotomy       Prior Functioning     Home Living Family/patient expects to be discharged to:: Private residence Living Arrangements: Parent Available Help at Discharge: Family;Friend(s);Available 24 hours/day Type of Home: House Home Access: Level entry Home Layout: Two level Alternate Level Stairs-Number of Steps: 12 Alternate Level Stairs-Rails: Right Home Equipment: None Prior Function Level of Independence: Independent Communication Communication: No difficulties Dominant Hand: Right         Vision/Perception  Vision - History Baseline Vision: No visual deficits   Cognition  Cognition Arousal/Alertness: Awake/alert Behavior During Therapy: WFL for tasks assessed/performed Overall Cognitive Status: Within Functional Limits for tasks assessed    Extremity/Trunk Assessment Upper Extremity Assessment Upper Extremity Assessment: Overall WFL for tasks assessed Lower Extremity Assessment Lower Extremity Assessment: Defer to PT evaluation LLE Deficits / Details: limited AROM at ankle and knee due to onset of pain, able to initiate quad set Cervical / Trunk Assessment Cervical / Trunk Assessment: Normal     Mobility       Exercise     Balance     End of Session OT - End of Session Activity Tolerance: Patient tolerated treatment well Patient left: in bed;with call bell/phone within reach  GO     Evern Bio 08/13/2013, 10:11 AM [phone]

## 2013-08-13 NOTE — Anesthesia Preprocedure Evaluation (Addendum)
Anesthesia Evaluation  Patient identified by MRN, date of birth, ID band Patient awake    Reviewed: Allergy & Precautions, H&P , NPO status , Patient's Chart, lab work & pertinent test results  Airway Mallampati: II TM Distance: >3 FB Neck ROM: Full    Dental no notable dental hx. (+) Teeth Intact and Dental Advisory Given   Pulmonary Current Smoker,  breath sounds clear to auscultation  Pulmonary exam normal       Cardiovascular negative cardio ROS  Rhythm:Regular Rate:Normal     Neuro/Psych negative neurological ROS  negative psych ROS   GI/Hepatic negative GI ROS, Neg liver ROS,   Endo/Other  negative endocrine ROS  Renal/GU negative Renal ROS  negative genitourinary   Musculoskeletal   Abdominal   Peds  Hematology negative hematology ROS (+)   Anesthesia Other Findings   Reproductive/Obstetrics negative OB ROS                           Anesthesia Physical Anesthesia Plan  ASA: II  Anesthesia Plan: General   Post-op Pain Management:    Induction: Intravenous  Airway Management Planned: LMA  Additional Equipment:   Intra-op Plan:   Post-operative Plan: Extubation in OR  Informed Consent: I have reviewed the patients History and Physical, chart, labs and discussed the procedure including the risks, benefits and alternatives for the proposed anesthesia with the patient or authorized representative who has indicated his/her understanding and acceptance.   Dental advisory given  Plan Discussed with: CRNA and Surgeon  Anesthesia Plan Comments:         Anesthesia Quick Evaluation  

## 2013-08-13 NOTE — H&P (View-Only) (Signed)
[facility] Progress Note  08/12/2013 7:25 AM 1 Day Post-Op  Subjective:  Foot is feeling better;  Pain is controlled with PCA  Afebrile VSS  Filed Vitals:   08/12/13 0425  BP: 135/70  Pulse: 78  Temp: 98.2 F (36.8 C)  Resp: 16    Physical Exam: Incisions:  Above knee incision is c/d/i with staples in tact. Extremities:  Left foot is warm and well perfused; 2+ palpable DP left Lungs:  Non labored Cardiac:  regular  CBC    Component Value Date/Time   WBC 15.0* 08/12/2013 0115   RBC 3.71* 08/12/2013 0115   HGB 11.6* 08/12/2013 0115   HCT 33.7* 08/12/2013 0115   PLT 205 08/12/2013 0115   MCV 90.8 08/12/2013 0115   MCH 31.3 08/12/2013 0115   MCHC 34.4 08/12/2013 0115   RDW 13.2 08/12/2013 0115   LYMPHSABS 3.7 08/11/2013 1421   MONOABS 0.5 08/11/2013 1421   EOSABS 0.2 08/11/2013 1421   BASOSABS 0.0 08/11/2013 1421    BMET    Component Value Date/Time   NA 135 08/12/2013 0115   K 4.3 08/12/2013 0115   CL 101 08/12/2013 0115   CO2 24 08/12/2013 0115   GLUCOSE 167* 08/12/2013 0115   BUN 9 08/12/2013 0115   CREATININE 0.97 08/12/2013 0115   CALCIUM 8.7 08/12/2013 0115   GFRNONAA >90 08/12/2013 0115   GFRAA >90 08/12/2013 0115    INR    Component Value Date/Time   INR 1.14 08/11/2013 1809     Intake/Output Summary (Last 24 hours) at 08/12/13 0725 Last data filed at 08/12/13 0400  Gross per 24 hour  Intake   3520 ml  Output   2855 ml  Net    665 ml   Drain output:  130cc bloody drainage + what is in the drain now  Assessment:  22 y.o. male is s/p:  Repair of left above-knee popliteal artery with contralateral reversed greater saphenous vein interposition graft, thrombectomy of left popliteal artery, 4 compartment fasciotomy  1 Day Post-Op  Plan: -acute blood loss anemia-tolerating.  Does have drain and will keep this in place at least another day -DVT prophylaxis:  None at this time-high risk of bleeding, SCDs ok -will check  fasciotomy sites later today. -leave foley for now until pt can mobilize a little better -2+ palpable left DP  -mobilize today-work with PT -BUN/Cr normal with good UOP after intraoperative arteriogram. -WBC increasing-pt is afebrile-receiving 2 doses of Ancef perioperatively    Doreatha Massed, PA-C [facility] [phone] 08/12/2013 7:25 AM    Muscle viable 2+ DP pulse No hematoma Motor ankle knee intact but painful Leave drain for today Plan to possibly close fasciotomies tomorrow SCDs for DVT prophylaxis start SQ heparin in 24 hours if no bleeding NPO p midnight  Consent Leukocytosis probably reactive Acute blood loss anemia asymptomatic follow  Fabienne Bruns, MD [facility] [place] Office: [phone] Pager: [phone]
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
## 2013-08-13 NOTE — Progress Notes (Signed)
Physician notified: Fields At: 1810  Regarding: Zinacef ordered as IM? No diet ordered?  Awaiting return response.   No returned response. Pharmacy changed abx to IV.

## 2013-08-13 NOTE — Progress Notes (Signed)
Waste morphine PCA syringe in pyxis with Domingo Dimes, RN. 15mg  wasted in sink. Pt. Sent to OR.

## 2013-08-13 NOTE — Preoperative (Signed)
Beta Blockers   Reason not to administer Beta Blockers:Not Applicable 

## 2013-08-13 NOTE — Anesthesia Postprocedure Evaluation (Signed)
  Anesthesia Post-op Note  Patient: Colin Thompson  Procedure(s) Performed: Procedure(s): FASCIOTOMY CLOSURE (Left)  Patient Location: PACU  Anesthesia Type:General  Level of Consciousness: awake and alert   Airway and Oxygen Therapy: Patient Spontanous Breathing  Post-op Pain: mild  Post-op Assessment: Post-op Vital signs reviewed, Patient's Cardiovascular Status Stable and Respiratory Function Stable  Post-op Vital Signs: Reviewed  Filed Vitals:   08/13/13 1609  BP:   Pulse:   Temp: 37.5 C  Resp:     Complications: No apparent anesthesia complications

## 2013-08-13 NOTE — Transfer of Care (Signed)
Immediate Anesthesia Transfer of Care Note  Patient: Colin Thompson  Procedure(s) Performed: Procedure(s): FASCIOTOMY CLOSURE (Left)  Patient Location: PACU  Anesthesia Type:General  Level of Consciousness: awake, alert  and oriented  Airway & Oxygen Therapy: Patient Spontanous Breathing and Patient connected to nasal cannula oxygen  Post-op Assessment: Report given to PACU RN and Patient moving all extremities X 4  Post vital signs: Reviewed and stable  Complications: No apparent anesthesia complications

## 2013-08-13 NOTE — Progress Notes (Signed)
Pt c/o foley catheter irritating him and continues to say he wants it out. MD notified. New orders received to d/c foley. Will d/c and continue to monitor closely.

## 2013-08-13 NOTE — Op Note (Addendum)
Procedure: Closure of left leg fasciotomies  Preoperative diagnosis: Open wound left leg  Postoperative diagnosis: Same  Anesthesia Gen.  Assistant: Nurse  Operative details: After obtaining informed consent, the patient was taken to the operating room. The patient was placed in supine position on operating table. After induction of general anesthesia the patient's left lower extremity was prepped and draped in the usual sterile fashion. The lateral and medial fasciotomy wounds were inspected. The muscle was pink and viable. There was essentially no tension on the skin edges. I then proceeded to irrigate both wounds thoroughly with a liter of normal saline solution. The skin edges were then reapproximated with staples. The patient tolerated procedure well and there were no complications. Instrument sponge and needle counts were correct at the end of the case. The patient was taken to the recovery room in stable condition.  Nathon Stefanski, MD [facility] [place] Office: [phone] Pager: [phone]  

## 2013-08-14 ENCOUNTER — Encounter (HOSPITAL_COMMUNITY): Payer: Self-pay | Admitting: Vascular Surgery

## 2013-08-14 MED ORDER — OXYCODONE-ACETAMINOPHEN 5-325 MG PO TABS: 1.0000 | ORAL_TABLET | ORAL | Status: DC | PRN

## 2013-08-14 NOTE — Progress Notes (Signed)
Physician notified: Lelon Mast PA At: 1231  Regarding: OK to DC by SW, just waiting on crutches. Need DC orders placed Awaiting return response.   Returned Response at: 1232  Order(s): Samantha in [place], OR RN will pass message along.
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
## 2013-08-14 NOTE — Clinical Social Work Note (Signed)
CSW met with patient at bedside to help treatment team determine where patient will DC. Patient states he will stay with his mom. RNCM notified. No other CSW needs identified. CSW signing off.  Roddie Mc, [place], [place], [phone]

## 2013-08-14 NOTE — Progress Notes (Signed)
VASCULAR LAB PRELIMINARY  ARTERIAL  ABI completed:    RIGHT    LEFT    PRESSURE WAVEFORM  PRESSURE WAVEFORM  BRACHIAL 138 Triphasic BRACHIAL 145 Triphasic  DP 159 Triphasic DP 122 Triphasic  PT 124 Biphasic PT 144 Triphasic    RIGHT LEFT  ABI 1.10 0.97   ABIs and Doppler waveforms are within normal limits bilaterally at rest post operative.  Kalani Sthilaire, RVS 08/14/2013, 10:39 AM

## 2013-08-14 NOTE — Progress Notes (Signed)
1430: Discharge instructions given to patient and mother, Luster Landsberg. Explained to patient office will call with follow up appointment to remove staples. Explained wound care and intercourse restriction. Rx given to patient. VSS. Ortho tech brought crutches to patient room for home. eICU and CCMT notified of DC. Awaiting to remove PIV until time of discharge. No distress or complaints at this time.   All belongings to be sent home with patient including crutches, phone, and charger. Pt. Escorted via wheelchair by 3S staff.   1700: Discharge delayed due to patient transportation. Mother will drive patient per private vehicle once off work, she works on [employer].
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
## 2013-08-14 NOTE — Progress Notes (Signed)
Physical Therapy Treatment Patient Details Name: Colin Thompson MRN: [id number] DOB: [birth year] Today's Date: 08/14/2013 Time: 4098-1191 PT Time Calculation (min): 27 min  PT Assessment / Plan / Recommendation  History of Present Illness Repair of left above-knee popliteal artery with contralateral reversed greater saphenous vein interposition graft, thrombectomy of left popliteal artery, 4 compartment fasciotomy; closure of fasciotomy 08/13/13   PT Comments   Patient progressed with crutches to feel safe with using them solely with assist for at lest couple of days.  Able to negotiate stairs with crutches min assist.  Will likely benefit from HHPT rather than outpatient due to likely needing HHRN follow up as well for dressing changes and needs to begin gentle AAROM/AROM HEP ASAP. States will have help at d/c.   Follow Up Recommendations  Home health PT;Supervision/Assistance - 24 hour     Does the patient have the potential to tolerate intense rehabilitation   N/A  Barriers to Discharge  None      Equipment Recommendations  Crutches    Recommendations for Other Services  None  Frequency Min 4X/week   Progress towards PT Goals Progress towards PT goals: Progressing toward goals  Plan Current plan remains appropriate    Precautions / Restrictions Precautions Precautions: Fall Restrictions Weight Bearing Restrictions: Yes LLE Weight Bearing: Weight bearing as tolerated   Pertinent Vitals/Pain Min-mod left LE with mobility    Mobility  Bed Mobility Supine to Sit: 4: Min assist;HOB elevated Sitting - Scoot to Edge of Bed: 5: Supervision Details for Bed Mobility Assistance: assist for L LE management Transfers Transfers: Squat Pivot Transfers Sit to Stand: 4: Min assist;From chair/3-in-1 Squat Pivot Transfers: 5: Supervision Details for Transfer Assistance: pivot to chair supervision and cues for leg management, hand placement, standing from chair managing crutches with min  assist Ambulation/Gait Ambulation/Gait Assistance: 4: Min assist Ambulation Distance (Feet): 130 Feet Assistive device: Crutches Ambulation/Gait Assistance Details: cues for at least touch down on left to decrease jarring of leg, pt did for 2 steps then back to full NWB, loss of balance x 2-3 min support for recovery; educated to have assist walking for few days till more stable. Gait Pattern: Step-to pattern Stairs: Yes Stairs Assistance: 4: Min assist Stairs Assistance Details (indicate cue type and reason): cues for sequence and safety with hand placement on rail Stair Management Technique: One rail Right;With crutches;Forwards Number of Stairs: 10    Exercises Other Exercises Other Exercises: educated on safety with crutches and fall prevention as well as car transfers    PT Goals (current goals can now be found in the care plan section)    Visit Information  Last PT Received On: 08/14/13 Assistance Needed: +1 History of Present Illness: Repair of left above-knee popliteal artery with contralateral reversed greater saphenous vein interposition graft, thrombectomy of left popliteal artery, 4 compartment fasciotomy; closure of fasciotomy 08/13/13    Subjective Data   I think the crutches will be okay.   Cognition  Cognition Arousal/Alertness: Awake/alert Behavior During Therapy: WFL for tasks assessed/performed Overall Cognitive Status: Within Functional Limits for tasks assessed    Balance  Static Standing Balance Static Standing - Balance Support: Bilateral upper extremity supported;During functional activity Static Standing - Level of Assistance: 5: Stand by assistance  End of Session PT - End of Session Equipment Utilized During Treatment: Gait belt Activity Tolerance: Patient tolerated treatment well Patient left: in chair;with call bell/phone within reach   GP     [facility] 08/14/2013, 9:35 AM Sheran Lawless, PT  [phone] 08/14/2013   

## 2013-08-14 NOTE — Progress Notes (Signed)
Orthopedic Tech Progress Note Patient Details:  Colin Thompson [birth year] [id number]  Ortho Devices Type of Ortho Device: Crutches Ortho Device/Splint Interventions: Ordered;Adjustment   Jennye Moccasin 08/14/2013, 5:14 PM

## 2013-08-14 NOTE — Progress Notes (Signed)
Physician notified: Lelon Mast  At: 1440  Regarding: Restrictions on intercourse? F/u appt in 2-3 weeks?  Awaiting return response.   Returned Response at: 1445  Order(s): No sexual activity until seen by Dr. Darrick Penna. Office will call with appointment for staple removal.

## 2013-08-14 NOTE — Progress Notes (Signed)
Pt. Discharged to home, escorted via wheelchair by NT. All belongings sent with patient.

## 2013-08-15 LAB — TYPE AND SCREEN
Antibody Screen: NEGATIVE
Unit division: 0
Unit division: 0

## 2013-08-26 ENCOUNTER — Encounter: Payer: Self-pay | Admitting: Vascular Surgery

## 2013-08-27 ENCOUNTER — Ambulatory Visit (INDEPENDENT_AMBULATORY_CARE_PROVIDER_SITE_OTHER): Payer: Self-pay | Admitting: Vascular Surgery

## 2013-08-27 ENCOUNTER — Encounter: Payer: Self-pay | Admitting: Vascular Surgery

## 2013-08-27 VITALS — BP 135/69 | HR 80 | Temp 98.2°F | Ht 73.0 in | Wt 190.9 lb

## 2013-08-27 DIAGNOSIS — Z48812 Encounter for surgical aftercare following surgery on the circulatory system: Secondary | ICD-10-CM

## 2013-08-27 DIAGNOSIS — S85002D Unspecified injury of popliteal artery, left leg, subsequent encounter: Secondary | ICD-10-CM

## 2013-08-27 DIAGNOSIS — S85009A Unspecified injury of popliteal artery, unspecified leg, initial encounter: Secondary | ICD-10-CM | POA: Insufficient documentation

## 2013-08-27 DIAGNOSIS — Z5189 Encounter for other specified aftercare: Secondary | ICD-10-CM
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
## 2013-08-27 NOTE — Addendum Note (Signed)
Addended by: Sharee Pimple on: 08/27/2013 11:05 AM   Modules accepted: Orders

## 2013-09-01 ENCOUNTER — Other Ambulatory Visit: Payer: Self-pay | Admitting: *Deleted

## 2013-09-01 DIAGNOSIS — S85002D Unspecified injury of popliteal artery, left leg, subsequent encounter: Secondary | ICD-10-CM

## 2013-09-08 ENCOUNTER — Ambulatory Visit: Payer: 59 | Attending: Vascular Surgery

## 2013-09-08 DIAGNOSIS — M25569 Pain in unspecified knee: Secondary | ICD-10-CM | POA: Insufficient documentation

## 2013-09-08 DIAGNOSIS — IMO0001 Reserved for inherently not codable concepts without codable children: Secondary | ICD-10-CM | POA: Insufficient documentation

## 2013-09-08 DIAGNOSIS — M25669 Stiffness of unspecified knee, not elsewhere classified: Secondary | ICD-10-CM | POA: Insufficient documentation

## 2013-09-08 DIAGNOSIS — R262 Difficulty in walking, not elsewhere classified: Secondary | ICD-10-CM | POA: Insufficient documentation

## 2013-09-09 ENCOUNTER — Ambulatory Visit: Payer: 59

## 2013-09-11 ENCOUNTER — Ambulatory Visit: Payer: 59

## 2013-09-15 ENCOUNTER — Ambulatory Visit: Payer: 59

## 2013-09-16 ENCOUNTER — Encounter (HOSPITAL_COMMUNITY): Payer: Self-pay | Admitting: Vascular Surgery

## 2013-09-16 ENCOUNTER — Ambulatory Visit: Payer: 59

## 2013-09-16 NOTE — OR Nursing (Signed)
LATE ENTRY: Procedure end time entered 

## 2013-09-18 ENCOUNTER — Ambulatory Visit: Payer: 59

## 2013-09-23 ENCOUNTER — Ambulatory Visit: Payer: 59

## 2014-02-25 ENCOUNTER — Encounter: Payer: Self-pay | Admitting: Family

## 2014-02-26 ENCOUNTER — Inpatient Hospital Stay (HOSPITAL_COMMUNITY): Admission: RE | Admit: 2014-02-26 | Payer: 59 | Source: Ambulatory Visit

## 2014-02-26 ENCOUNTER — Ambulatory Visit: Payer: 59 | Admitting: Family

## 2016-06-24 ENCOUNTER — Encounter (HOSPITAL_COMMUNITY): Payer: Self-pay | Admitting: Emergency Medicine

## 2016-06-24 ENCOUNTER — Emergency Department (HOSPITAL_COMMUNITY)
Admission: EM | Admit: 2016-06-24 | Discharge: 2016-06-24 | Disposition: A | Discharge status: Home / Self Care | Payer: 59 | Attending: Emergency Medicine | Admitting: Emergency Medicine

## 2016-06-24 DIAGNOSIS — M79605 Pain in left leg: Secondary | ICD-10-CM

## 2016-06-24 DIAGNOSIS — F17210 Nicotine dependence, cigarettes, uncomplicated: Secondary | ICD-10-CM | POA: Diagnosis not present

## 2016-06-24 DIAGNOSIS — R300 Dysuria: Secondary | ICD-10-CM | POA: Diagnosis present

## 2016-06-24 DIAGNOSIS — M79652 Pain in left thigh: Secondary | ICD-10-CM | POA: Diagnosis not present

## 2016-06-24 DIAGNOSIS — N342 Other urethritis: Secondary | ICD-10-CM | POA: Insufficient documentation

## 2016-06-24 LAB — URINALYSIS, ROUTINE W REFLEX MICROSCOPIC
BILIRUBIN URINE: NEGATIVE
Glucose, UA: NEGATIVE mg/dL
Hgb urine dipstick: NEGATIVE
KETONES UR: NEGATIVE mg/dL
NITRITE: NEGATIVE
Protein, ur: NEGATIVE mg/dL
Specific Gravity, Urine: 1.028 (ref 1.005–1.030)
pH: 7 (ref 5.0–8.0)

## 2016-06-24 LAB — URINE MICROSCOPIC-ADD ON: RBC / HPF: NONE SEEN RBC/hpf (ref 0–5)

## 2016-06-24 MED ORDER — AZITHROMYCIN 250 MG PO TABS
1000.0000 mg | ORAL_TABLET | Freq: Once | ORAL | Status: AC
  Administered 2016-06-24: 1000 mg via ORAL
  Filled 2016-06-24: qty 4

## 2016-06-24 MED ORDER — CEFTRIAXONE SODIUM 250 MG IJ SOLR
250.0000 mg | Freq: Once | INTRAMUSCULAR | Status: AC
  Administered 2016-06-24: 250 mg via INTRAMUSCULAR
  Filled 2016-06-24: qty 250

## 2016-06-24 MED ORDER — LIDOCAINE HCL (PF) 1 % IJ SOLN
INTRAMUSCULAR | Status: AC
  Filled 2016-06-24: qty 5
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
## 2016-06-24 NOTE — ED Triage Notes (Signed)
Pt. reports " burning " when urinating/penile discharge onset this week , requesting STD screening , pt. Added mild left leg pain for several weeks - denies injury /ambulatory .

## 2016-06-24 NOTE — ED Notes (Signed)
Called Lab to add on lab

## 2016-06-26 LAB — GC/CHLAMYDIA PROBE AMP (~~LOC~~) NOT AT ARMC
CHLAMYDIA, DNA PROBE: NEGATIVE
Neisseria Gonorrhea: POSITIVE — AB

## 2016-06-28 NOTE — ED Notes (Signed)
Called and spoke with patient , aware treatment was appropriate and education given to contact sexual partner and  No sexually activity x 2 weeks.

## 2017-01-12 ENCOUNTER — Emergency Department (HOSPITAL_COMMUNITY)
Admission: EM | Admit: 2017-01-12 | Discharge: 2017-01-12 | Disposition: A | Discharge status: Home / Self Care | Payer: 59 | Attending: Emergency Medicine | Admitting: Emergency Medicine

## 2017-01-12 ENCOUNTER — Encounter (HOSPITAL_COMMUNITY): Payer: Self-pay

## 2017-01-12 DIAGNOSIS — Z202 Contact with and (suspected) exposure to infections with a predominantly sexual mode of transmission: Secondary | ICD-10-CM | POA: Diagnosis not present

## 2017-01-12 DIAGNOSIS — Z79899 Other long term (current) drug therapy: Secondary | ICD-10-CM | POA: Diagnosis not present

## 2017-01-12 DIAGNOSIS — R300 Dysuria: Secondary | ICD-10-CM | POA: Insufficient documentation

## 2017-01-12 DIAGNOSIS — F17210 Nicotine dependence, cigarettes, uncomplicated: Secondary | ICD-10-CM | POA: Diagnosis not present

## 2017-01-12 LAB — URINALYSIS, ROUTINE W REFLEX MICROSCOPIC
BILIRUBIN URINE: NEGATIVE
Glucose, UA: NEGATIVE mg/dL
HGB URINE DIPSTICK: NEGATIVE
Ketones, ur: NEGATIVE mg/dL
NITRITE: NEGATIVE
PH: 7 (ref 5.0–8.0)
Protein, ur: NEGATIVE mg/dL
Specific Gravity, Urine: 1.02 (ref 1.005–1.030)

## 2017-01-12 LAB — GC/CHLAMYDIA PROBE AMP (~~LOC~~) NOT AT ARMC
Chlamydia: NEGATIVE
NEISSERIA GONORRHEA: POSITIVE — AB

## 2017-01-12 MED ORDER — CEFTRIAXONE SODIUM 250 MG IJ SOLR
250.0000 mg | INTRAMUSCULAR | Status: DC
  Administered 2017-01-12: 250 mg via INTRAMUSCULAR
  Filled 2017-01-12: qty 250

## 2017-01-12 MED ORDER — AZITHROMYCIN 250 MG PO TABS
1000.0000 mg | ORAL_TABLET | Freq: Once | ORAL | Status: AC
  Administered 2017-01-12: 1000 mg via ORAL
  Filled 2017-01-12: qty 4

## 2017-01-12 NOTE — Discharge Instructions (Signed)
You have been treated for STDs today. Do not engage in sexual intercourse for one week. Call the health department in 2 days regarding the results of your STD tests. If you are positive for STDs, notify your sexual partner of their need to be tested and treated for STDs as well. You may return to the emergency department, as needed, for new or concerning symptoms.

## 2017-01-12 NOTE — ED Notes (Signed)
Pt stable, ambulatory, states understanding of discharge instructions 

## 2017-01-12 NOTE — ED Provider Notes (Signed)
[facility] Provider Note   CSN: [id number] Arrival date & time: 01/12/17  0245     History   Chief Complaint Chief Complaint  Patient presents with  . Exposure to STD    HPI Colin Thompson is a 26 y.o. male.  26 year old male presents to the emergency department for evaluation of green penile discharge times one day. He reports an episode of unprotected oral sex 2 days ago with a male partner. He has had some mild dysuria associated with his discharge as well. No fevers, abdominal pain, vomiting. No medications taken prior to arrival. Patient reports a history of chlamydia.    The history is provided by the patient. No language interpreter was used.  Exposure to STD  This is a new problem. The current episode started 2 days ago. The problem has been gradually worsening. Associated symptoms comments: Penile discharge and dysuria. Nothing relieves the symptoms. He has tried nothing for the symptoms. The treatment provided no relief.    History reviewed. No pertinent past medical history.  Patient Active Problem List   Diagnosis Date Noted  . 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 FASCIOTOMY CLOSURE Left 08/13/2013   Procedure: FASCIOTOMY CLOSURE;  Surgeon: Sherren Kernsharles E Fields, MD;  [facility];  Service: Vascular;  Laterality: Left;  . FEMORAL-POPLITEAL BYPASS GRAFT Left 08/11/2013   Procedure: Repair of Left Popliteal Artery using Left Above Knee Popliteal to Above Knee Popliteal Bypass Graft with interpositional contralateral reverse vein graft; fourth compartment fasciotomy;  Surgeon: Sherren Kernsharles E Fields, MD;  [facility];  Service: Vascular;  Laterality: Left;  . INTRAOPERATIVE ARTERIOGRAM Left 08/11/2013   Procedure: INTRA OPERATIVE ARTERIOGRAM;  Surgeon: Sherren Kernsharles E Fields, MD;  [facility];  Service: Vascular;  Laterality: Left;       Home Medications     Prior to Admission medications   Medication Sig Start Date End Date Taking? Authorizing Provider  oxyCODONE-acetaminophen (PERCOCET/ROXICET) 5-325 MG per tablet Take 1-2 tablets by mouth every 4 (four) hours as needed for moderate pain. 08/14/13   Dara Lordshyne, Samantha J, PA-C    Family History History reviewed. No pertinent family history.  Social History Social History  Substance Use Topics  . Smoking status: Current Every Day Smoker    Packs/day: 0.50    Types: Cigarettes  . Smokeless tobacco: Never Used     Comment: pt states that he has not been smoking alot lately but has not quit  . Alcohol use Yes     Comment: occasionally     Allergies   Patient has no known allergies.   Review of Systems Review of Systems Ten systems reviewed and are negative for acute change, except as noted in the HPI.    Physical Exam Updated Vital Signs BP (!) 157/65 (BP Location: Right Arm)   Pulse 73   Temp 98 F (36.7 C) (Oral)   Resp 12   Ht 6\' 1"  (1.854 m)   Wt 250 lb (113.4 kg)   SpO2 100%   BMI 32.98 kg/m   Physical Exam  Constitutional: He is oriented to person, place, and time. He appears well-developed and well-nourished. No distress.  HENT:  Head: Normocephalic and atraumatic.  Eyes: Conjunctivae and EOM are normal. No scleral icterus.  Neck: Normal range of motion.  Pulmonary/Chest: Effort normal. No respiratory distress.  Genitourinary: Right testis shows no mass, no swelling and no tenderness. Right  testis is descended. Left testis shows no mass, no swelling and no tenderness. Left testis is descended. Circumcised. No penile erythema. No discharge found.  Genitourinary Comments: Exam chaperoned by Shelly Rubenstein, RN. No appreciable abnormalities to genitalia. Lymphadenopathy noted; nontender.  Musculoskeletal: Normal range of motion.  Lymphadenopathy: Inguinal adenopathy noted on the right (mild) and left (mild) side.  Neurological: He is alert and oriented to person, place,  and time. He exhibits normal muscle tone. Coordination normal.  Skin: Skin is warm and dry. No rash noted. He is not diaphoretic. No erythema. No pallor.  Psychiatric: He has a normal mood and affect. His behavior is normal.  Nursing note and vitals reviewed.    ED Treatments / Results  Labs (all labs ordered are listed, but only abnormal results are displayed) Labs Reviewed  URINALYSIS, ROUTINE W REFLEX MICROSCOPIC - Abnormal; Notable for the following:       Result Value   APPearance CLOUDY (*)    Leukocytes, UA SMALL (*)    Bacteria, UA RARE (*)    Squamous Epithelial / LPF 0-5 (*)    All other components within normal limits  HIV ANTIBODY (ROUTINE TESTING)  RPR  GC/CHLAMYDIA PROBE AMP ([place]) NOT AT [facility]    EKG  EKG Interpretation None       Radiology No results found.  Procedures Procedures (including critical care time)  Medications Ordered in ED Medications  cefTRIAXone (ROCEPHIN) injection 250 mg (not administered)  azithromycin (ZITHROMAX) tablet 1,000 mg (not administered)     Initial Impression / Assessment and Plan / ED Course  I have reviewed the triage vital signs and the nursing notes.  Pertinent labs & imaging results that were available during my care of the patient were reviewed by me and considered in my medical decision making (see chart for details).     Patient to be discharged with instructions to follow up with the health department. Discussed importance of using protection when sexually active. Pt understands that they have GC/Chlamydia cultures pending and that they will need to inform all sexual partners if results return positive. Pt has been treated prophylacticly with azithromycin and rocephin. Return precautions discussed and provided. Patient discharged in stable condition with no unaddressed concerns.   Final Clinical Impressions(s) / ED Diagnoses   Final diagnoses:  Possible exposure to STD    New Prescriptions New  Prescriptions   No medications on file     Antony Madura, Cordelia Poche 01/12/17 0426    Glynn Octave, MD 01/12/17 [phone]

## 2017-01-12 NOTE — ED Triage Notes (Signed)
Pt endorses having unprotected sex recently with another party and is now having green penile discharge, has hx of chlamydia. Denies any other sx. VSS.

## 2017-07-11 ENCOUNTER — Encounter (HOSPITAL_COMMUNITY): Payer: Self-pay | Admitting: Emergency Medicine

## 2017-07-11 ENCOUNTER — Ambulatory Visit (HOSPITAL_COMMUNITY)
Admission: EM | Admit: 2017-07-11 | Discharge: 2017-07-11 | Disposition: A | Discharge status: Home / Self Care | Payer: 59 | Attending: Family Medicine | Admitting: Family Medicine

## 2017-07-11 ENCOUNTER — Other Ambulatory Visit: Payer: Self-pay

## 2017-07-11 DIAGNOSIS — H1033 Unspecified acute conjunctivitis, bilateral: Secondary | ICD-10-CM | POA: Diagnosis not present

## 2017-07-11 MED ORDER — TOBRAMYCIN 0.3 % OP SOLN: 1.0000 [drp] | Freq: Four times a day (QID) | OPHTHALMIC | 0 refills | Status: DC

## 2017-07-11 NOTE — ED Triage Notes (Signed)
Pt thinks he has pink eye. L eye is red. Pt c/o irritation in both eyes.

## 2017-07-11 NOTE — ED Provider Notes (Signed)
  [facility]   [id number] 07/11/17 Arrival Time: 1604  ASSESSMENT & PLAN:  1. Acute conjunctivitis of both eyes, unspecified acute conjunctivitis type     Meds ordered this encounter  Medications  . tobramycin (TOBREX) 0.3 % ophthalmic solution    Sig: Place 1 drop every 6 (six) hours into the right eye.    Dispense:  5 mL    Refill:  0   Will f/u if not improving over the next few days. Handwashing. Reviewed expectations re: course of current medical issues. Questions answered. Outlined signs and symptoms indicating need for more acute intervention. Patient verbalized understanding. After Visit Summary given.   SUBJECTIVE:  Colin Thompson is a 26 y.o. male who presents with complaint of L eye irritation beginning a few days ago. R eye with slight irritation. Watery drainage. Mild cold symptoms recently. Vision normal. No OTC treatment.  Does not wear contact lenses. Girlfriend with similar symptoms recently.  ROS: As per HPI.   OBJECTIVE:  Vitals:   07/11/17 1627  BP: 138/70  Pulse: 68  Resp: 18  Temp: 97.6 F (36.4 C)  SpO2: 100%    General appearance: alert; no distress Eyes: L conjunctival irritation/erythema with watery drainage; R eye appears normal; PERRLA; EOMI Neck: supple Lungs: clear to auscultation bilaterally Heart: regular rate and rhythm Skin: warm and dry Psychological: alert and cooperative; normal mood and affect  No Known Allergies   Social History   Socioeconomic History  . Marital status: Single    Spouse name: Not on file  . Number of children: Not on file  . Years of education: Not on file  . Highest education level: Not on file  Social Needs  . Financial resource strain: Not on file  . Food insecurity - worry: Not on file  . Food insecurity - inability: Not on file  . Transportation needs - medical: Not on file  . Transportation needs - non-medical: Not on file  Occupational History  . Not on file  Tobacco Use    . Smoking status: Current Every Day Smoker    Packs/day: 0.50    Types: Cigarettes  . Smokeless tobacco: Never Used  . Tobacco comment: pt states that he has not been smoking alot lately but has not quit  Substance and Sexual Activity  . Alcohol use: Yes    Comment: occasionally  . Drug use: Yes    Types: Marijuana  . Sexual activity: Not on file  Other Topics Concern  . Not on file  Social History Narrative  . Not on file      Mardella LaymanHagler, Jessina Marse, MD 07/14/17 [phone]

## 2017-10-15 ENCOUNTER — Encounter (HOSPITAL_COMMUNITY): Payer: Self-pay | Admitting: Emergency Medicine

## 2017-10-15 ENCOUNTER — Other Ambulatory Visit: Payer: Self-pay

## 2017-10-15 ENCOUNTER — Ambulatory Visit (HOSPITAL_COMMUNITY)
Admission: EM | Admit: 2017-10-15 | Discharge: 2017-10-15 | Disposition: A | Discharge status: Home / Self Care | Payer: 59 | Attending: Family Medicine | Admitting: Family Medicine

## 2017-10-15 DIAGNOSIS — R369 Urethral discharge, unspecified: Secondary | ICD-10-CM | POA: Diagnosis not present

## 2017-10-15 DIAGNOSIS — R300 Dysuria: Secondary | ICD-10-CM | POA: Insufficient documentation

## 2017-10-15 DIAGNOSIS — Z202 Contact with and (suspected) exposure to infections with a predominantly sexual mode of transmission: Secondary | ICD-10-CM

## 2017-10-15 DIAGNOSIS — F17210 Nicotine dependence, cigarettes, uncomplicated: Secondary | ICD-10-CM | POA: Insufficient documentation

## 2017-10-15 DIAGNOSIS — Z711 Person with feared health complaint in whom no diagnosis is made: Secondary | ICD-10-CM

## 2017-10-15 LAB — POCT URINALYSIS DIP (DEVICE)
Glucose, UA: 100 mg/dL — AB
Nitrite: NEGATIVE
PROTEIN: 100 mg/dL — AB
Specific Gravity, Urine: >=1.03 (ref 1.005–1.030)
Urobilinogen, UA: 1 mg/dL (ref 0.0–1.0)
pH: 5.5 (ref 5.0–8.0)

## 2017-10-15 MED ORDER — AZITHROMYCIN 250 MG PO TABS
ORAL_TABLET | ORAL | Status: AC
  Filled 2017-10-15: qty 4

## 2017-10-15 MED ORDER — LIDOCAINE HCL (PF) 1 % IJ SOLN
INTRAMUSCULAR | Status: AC
  Filled 2017-10-15: qty 2

## 2017-10-15 MED ORDER — CEFTRIAXONE SODIUM 250 MG IJ SOLR
250.0000 mg | Freq: Once | INTRAMUSCULAR | Status: AC
  Administered 2017-10-15: 250 mg via INTRAMUSCULAR

## 2017-10-15 MED ORDER — AZITHROMYCIN 250 MG PO TABS
1000.0000 mg | ORAL_TABLET | Freq: Once | ORAL | Status: AC
  Administered 2017-10-15: 1000 mg via ORAL

## 2017-10-15 MED ORDER — CEFTRIAXONE SODIUM 250 MG IJ SOLR
INTRAMUSCULAR | Status: AC
  Filled 2017-10-15: qty 250

## 2017-10-15 NOTE — Discharge Instructions (Signed)
We have treated you today for gonorrhea and chlamydia as the tests are pending.  We will call you with any positive test findings and if any further medications are needed. Please withhold from intercourse for the next week. Please use condoms to prevent STD's.   If symptoms worsen or do not improve in the next week to return to be seen or to follow up with your PCP.

## 2017-10-15 NOTE — ED Provider Notes (Signed)
[facility]    CSN: [id number] Arrival date & time: 10/15/17  1610     History   Chief Complaint Chief Complaint  Patient presents with  . Penile Discharge  . Dysuria    HPI Colin Thompson is a 27 y.o. male.   Colin Thompson presents with complaints of pain with urination and white penile discharge which started 2 days ago. States is similar to when he has had an STD in the past. States he was last sexually active approximately 1 month ago. Denies abdominal pain, back pain or fevers. Denies lesions or ulcers to the penis. Per chart review patient has had gonorrhea in the past. Denies blood to urine. Denies urinary frequency.     ROS per HPI.       History reviewed. No pertinent past medical history.  Patient Active Problem List   Diagnosis Date Noted  . 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 FASCIOTOMY CLOSURE Left 08/13/2013   Procedure: FASCIOTOMY CLOSURE;  Surgeon: Sherren Kerns, MD;  Location: [facility] OR;  Service: Vascular;  Laterality: Left;  . FEMORAL-POPLITEAL BYPASS GRAFT Left 08/11/2013   Procedure: Repair of Left Popliteal Artery using Left Above Knee Popliteal to Above Knee Popliteal Bypass Graft with interpositional contralateral reverse vein graft; fourth compartment fasciotomy;  Surgeon: Sherren Kerns, MD;  Location: [facility] OR;  Service: Vascular;  Laterality: Left;  . INTRAOPERATIVE ARTERIOGRAM Left 08/11/2013   Procedure: INTRA OPERATIVE ARTERIOGRAM;  Surgeon: Sherren Kerns, MD;  Location: [facility] OR;  Service: Vascular;  Laterality: Left;       Home Medications    Prior to Admission medications   Not on File    Family History History reviewed. No pertinent family history.  Social History Social History   Tobacco Use  . Smoking status: Current Every Day Smoker    Packs/day: 0.50    Types: Cigarettes  . Smokeless tobacco: Never Used  . Tobacco  comment: pt states that he has not been smoking alot lately but has not quit  Substance Use Topics  . Alcohol use: Yes    Comment: occasionally  . Drug use: Yes    Types: Marijuana     Allergies   Patient has no known allergies.   Review of Systems Review of Systems   Physical Exam Triage Vital Signs ED Triage Vitals  Enc Vitals Group     BP 10/15/17 1700 (!) 145/94     Pulse Rate 10/15/17 1700 73     Resp 10/15/17 1700 16     Temp 10/15/17 1700 98.4 F (36.9 C)     Temp Source 10/15/17 1700 Oral     SpO2 10/15/17 1700 100 %     Weight --      Height --      Head Circumference --      Peak Flow --      Pain Score 10/15/17 1704 0     Pain Loc --      Pain Edu? --      Excl. in GC? --    No data found.  Updated Vital Signs BP (!) 145/94 (BP Location: Right Arm)   Pulse 73   Temp 98.4 F (36.9 C) (Oral)   Resp 16   SpO2 100%   Visual Acuity Right Eye Distance:   Left Eye Distance:   Bilateral Distance:    Right Eye Near:  Left Eye Near:    Bilateral Near:     Physical Exam  Constitutional: He is oriented to person, place, and time. He appears well-developed and well-nourished.  Cardiovascular: Normal rate and regular rhythm.  Pulmonary/Chest: Effort normal and breath sounds normal.  Abdominal: There is no tenderness.  Genitourinary:  Genitourinary Comments: Denies lesions, ulcerations- exam deferred at this time.   Neurological: He is alert and oriented to person, place, and time.  Skin: Skin is warm and dry.     UC Treatments / Results  Labs (all labs ordered are listed, but only abnormal results are displayed) Labs Reviewed  POCT URINALYSIS DIP (DEVICE) - Abnormal; Notable for the following components:      Result Value   Glucose, UA 100 (*)    Bilirubin Urine SMALL (*)    Ketones, ur TRACE (*)    Hgb urine dipstick MODERATE (*)    Protein, ur 100 (*)    Leukocytes, UA SMALL (*)    All other components within normal limits  URINE  CULTURE  URINE CYTOLOGY ANCILLARY ONLY    EKG  EKG Interpretation None       Radiology No results found.  Procedures Procedures (including critical care time)  Medications Ordered in UC Medications  azithromycin (ZITHROMAX) tablet 1,000 mg (not administered)  cefTRIAXone (ROCEPHIN) injection 250 mg (not administered)     Initial Impression / Assessment and Plan / UC Course  I have reviewed the triage vital signs and the nursing notes.  Pertinent labs & imaging results that were available during my care of the patient were reviewed by me and considered in my medical decision making (see chart for details).     Noted urine dip findings, per patient history appears likely related to std. Empiric treatment provided today. Urine culture and urine cytology pending at this time. Will notify of any positive findings and if any changes to treatment are needed.  Encouraged condom use. If symptoms worsen or do not improve in the next week to return to be seen or to follow up with PCP.  Patient verbalized understanding and agreeable to plan.    Final Clinical Impressions(s) / UC Diagnoses   Final diagnoses:  Concern about STD in male without diagnosis  Penile discharge  Dysuria    ED Discharge Orders    None       Controlled Substance Prescriptions [place] Controlled Substance Registry consulted? Not Applicable   Georgetta HaberBurky, Natalie B, NP 10/15/17 1743

## 2017-10-15 NOTE — ED Triage Notes (Signed)
The patient presented to the [facility] with a complaint of penile discharge and dysuria x 2 days. 

## 2017-10-16 LAB — URINE CYTOLOGY ANCILLARY ONLY
CHLAMYDIA, DNA PROBE: NEGATIVE
Neisseria Gonorrhea: POSITIVE — AB
Trichomonas: NEGATIVE

## 2017-10-17 LAB — URINE CULTURE: Culture: NO GROWTH

## 2017-12-02 ENCOUNTER — Encounter (HOSPITAL_COMMUNITY): Payer: Self-pay | Admitting: *Deleted

## 2017-12-02 ENCOUNTER — Ambulatory Visit (HOSPITAL_COMMUNITY)
Admission: EM | Admit: 2017-12-02 | Discharge: 2017-12-02 | Disposition: A | Discharge status: Home / Self Care | Payer: 59 | Attending: Internal Medicine | Admitting: Internal Medicine

## 2017-12-02 ENCOUNTER — Other Ambulatory Visit: Payer: Self-pay

## 2017-12-02 DIAGNOSIS — R300 Dysuria: Secondary | ICD-10-CM | POA: Diagnosis not present

## 2017-12-02 DIAGNOSIS — F17210 Nicotine dependence, cigarettes, uncomplicated: Secondary | ICD-10-CM | POA: Insufficient documentation

## 2017-12-02 DIAGNOSIS — Z113 Encounter for screening for infections with a predominantly sexual mode of transmission: Secondary | ICD-10-CM

## 2017-12-02 DIAGNOSIS — Z95828 Presence of other vascular implants and grafts: Secondary | ICD-10-CM | POA: Diagnosis not present

## 2017-12-02 DIAGNOSIS — R369 Urethral discharge, unspecified: Secondary | ICD-10-CM | POA: Diagnosis not present

## 2017-12-02 LAB — POCT URINALYSIS DIP (DEVICE)
BILIRUBIN URINE: NEGATIVE
Glucose, UA: NEGATIVE mg/dL
HGB URINE DIPSTICK: NEGATIVE
Ketones, ur: NEGATIVE mg/dL
NITRITE: NEGATIVE
Protein, ur: NEGATIVE mg/dL
Specific Gravity, Urine: 1.025 (ref 1.005–1.030)
Urobilinogen, UA: 1 mg/dL (ref 0.0–1.0)
pH: 6.5 (ref 5.0–8.0)

## 2017-12-02 MED ORDER — CEFTRIAXONE SODIUM 250 MG IJ SOLR
INTRAMUSCULAR | Status: AC
  Filled 2017-12-02: qty 250

## 2017-12-02 MED ORDER — AZITHROMYCIN 250 MG PO TABS
ORAL_TABLET | ORAL | Status: AC
  Filled 2017-12-02: qty 4

## 2017-12-02 MED ORDER — AZITHROMYCIN 250 MG PO TABS
1000.0000 mg | ORAL_TABLET | Freq: Once | ORAL | Status: AC
  Administered 2017-12-02: 1000 mg via ORAL

## 2017-12-02 MED ORDER — CEFTRIAXONE SODIUM 250 MG IJ SOLR
250.0000 mg | Freq: Once | INTRAMUSCULAR | Status: AC
  Administered 2017-12-02: 250 mg via INTRAMUSCULAR

## 2017-12-02 MED ORDER — LIDOCAINE HCL 2 % IJ SOLN
INTRAMUSCULAR | Status: AC
  Filled 2017-12-02: qty 20

## 2017-12-02 NOTE — Discharge Instructions (Signed)
We have treated you today for gonorrhea and chlamydia, with Rocephin and azithromycin. Please refrain from sexual activity for 7 days while medicine is clearing infection. ° °We are testing you for Gonorrhea, Chlamydia and Trichomonas. We will call you if anything is positive and let you know if you require any further treatment. Please inform partner of any positive results. ° °Please return if symptoms not improving with treatment, development of fever, nausea, vomiting, abdominal pain, scrotal pain. °

## 2017-12-02 NOTE — ED Provider Notes (Signed)
[facility]    CSN: [id number] Arrival date & time: 12/02/17  1216     History   Chief Complaint Chief Complaint  Patient presents with  . Penile Discharge    HPI Colin Thompson is a 27 y.o. male presenting today with penile discharge and dysuria.  Symptoms began yesterday.  Patient is sexually active, but known known positive exposures to STDs.  Denies any rashes or lesions.  Denies fever, nausea, vomiting, abdominal pain.  Denies any testicular pain or scrotal swelling.  HPI  History reviewed. No pertinent past medical history.  Patient Active Problem List   Diagnosis Date Noted  . 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 FASCIOTOMY CLOSURE Left 08/13/2013   Procedure: FASCIOTOMY CLOSURE;  Surgeon: Sherren Kerns, MD;  [facility];  Service: Vascular;  Laterality: Left;  . FEMORAL-POPLITEAL BYPASS GRAFT Left 08/11/2013   Procedure: Repair of Left Popliteal Artery using Left Above Knee Popliteal to Above Knee Popliteal Bypass Graft with interpositional contralateral reverse vein graft; fourth compartment fasciotomy;  Surgeon: Sherren Kerns, MD;  Location[facility] OR;  Service: Vascular;  Laterality: Left;  . INTRAOPERATIVE ARTERIOGRAM Left 08/11/2013   Procedure: INTRA OPERATIVE ARTERIOGRAM;  Surgeon: Sherren Kerns, MD;  Location[facility] OR;  Service: Vascular;  Laterality: Left;       Home Medications    Prior to Admission medications   Not on File    Family History No family history on file.  Social History Social History   Tobacco Use  . Smoking status: Current Every Day Smoker    Packs/day: 0.50    Types: Cigarettes  . Smokeless tobacco: Never Used  . Tobacco comment: pt states that he has not been smoking alot lately but has not quit  Substance Use Topics  . Alcohol use: Not Currently    Comment: occasionally  . Drug use: Yes    Types: Marijuana      Allergies   Patient has no known allergies.   Review of Systems Review of Systems  Constitutional: Negative for fever.  HENT: Negative for sore throat.   Respiratory: Negative for shortness of breath.   Cardiovascular: Negative for chest pain.  Gastrointestinal: Negative for abdominal pain, nausea and vomiting.  Genitourinary: Positive for discharge and dysuria. Negative for difficulty urinating, frequency, penile pain, penile swelling, scrotal swelling and testicular pain.  Skin: Negative for rash.  Neurological: Negative for dizziness, light-headedness and headaches.     Physical Exam Triage Vital Signs ED Triage Vitals [12/02/17 1304]  Enc Vitals Group     BP 123/77     Pulse Rate 63     Resp 16     Temp 98 F (36.7 C)     Temp Source Oral     SpO2 100 %     Weight      Height      Head Circumference      Peak Flow      Pain Score 0     Pain Loc      Pain Edu?      Excl. in GC?    No data found.  Updated Vital Signs BP 123/77   Pulse 63   Temp 98 F (36.7 C) (Oral)   Resp 16   SpO2 100%   Visual Acuity Right Eye Distance:   Left Eye Distance:   Bilateral Distance:  Right Eye Near:   Left Eye Near:    Bilateral Near:     Physical Exam  Constitutional: He appears well-developed and well-nourished.  HENT:  Head: Normocephalic and atraumatic.  Eyes: Conjunctivae are normal.  Neck: Neck supple.  Cardiovascular: Normal rate and regular rhythm.  No murmur heard. Pulmonary/Chest: Effort normal and breath sounds normal. No respiratory distress.  Abdominal: Soft. There is no tenderness.  Genitourinary: Penis normal.  Genitourinary Comments: Mild amount of thin milky discharge in urethral meatus, no rashes or lesions observed, no scrotal swelling or epididymal tenderness  Musculoskeletal: He exhibits no edema.  Neurological: He is alert.  Skin: Skin is warm and dry.  Psychiatric: He has a normal mood and affect.  Nursing note and vitals  reviewed.    UC Treatments / Results  Labs (all labs ordered are listed, but only abnormal results are displayed) Labs Reviewed  URINE CYTOLOGY ANCILLARY ONLY    EKG None Radiology No results found.  Procedures Procedures (including critical care time)  Medications Ordered in UC Medications  cefTRIAXone (ROCEPHIN) injection 250 mg (has no administration in time range)  azithromycin (ZITHROMAX) tablet 1,000 mg (has no administration in time range)     Initial Impression / Assessment and Plan / UC Course  I have reviewed the triage vital signs and the nursing notes.  Pertinent labs & imaging results that were available during my care of the patient were reviewed by me and considered in my medical decision making (see chart for details).     27 year old with penile discharge and dysuria.  Urine cytology sent to check for STDs.  Will treat for gonorrhea and chlamydia with Rocephin and azithromycin.  Will call to inform of results and any further needed medications. Discussed strict return precautions. Patient verbalized understanding and is agreeable with plan.   Final Clinical Impressions(s) / UC Diagnoses   Final diagnoses:  Penile discharge  Dysuria    ED Discharge Orders    None       Controlled Substance Prescriptions [place] Controlled Substance Registry consulted? Not Applicable   Lew DawesWieters, Hallie C, [place] JerseyPA-C 12/02/17 1339

## 2017-12-02 NOTE — ED Triage Notes (Signed)
C/O penile discharge since yesterday with some slight dysuria.

## 2017-12-03 ENCOUNTER — Telehealth (HOSPITAL_COMMUNITY): Payer: Self-pay

## 2017-12-03 LAB — URINE CYTOLOGY ANCILLARY ONLY
Chlamydia: NEGATIVE
Neisseria Gonorrhea: POSITIVE — AB
TRICH (WINDOWPATH): POSITIVE — AB

## 2017-12-03 MED ORDER — METRONIDAZOLE 500 MG PO TABS: 500.0000 mg | ORAL_TABLET | Freq: Two times a day (BID) | ORAL | 0 refills | Status: DC

## 2017-12-03 NOTE — Telephone Encounter (Signed)
Test for gonorrhea was positive. This was treated at the urgent care visit with IM rocephin 250mg  and po zithromax 1g. Pt called regarding test results, instructed patient to refrain from sexual intercourse for 7 days after treatment to give the medicine time to work. Sexual partners need to be notified and tested/treated. Condoms may reduce risk of reinfection. Recheck or followup with PCP for further evaluation if symptoms are not improving. Answered all patient questions. [facility] notified.  Also test for trichomonas being positive. Rx metronidazole 500mg  bid x 7d #14 no refills was sent to the pharmacy of record. Educated patient to refrain from sexual intercourse for 7 days to give the medicine time to work. Sexual partners need to be notified and tested/treated. Condoms may reduce risk of reinfection.  Recheck for further evaluation if symptoms are not improving.

## 2018-06-09 ENCOUNTER — Encounter (HOSPITAL_COMMUNITY): Payer: Self-pay | Admitting: Emergency Medicine

## 2018-06-09 ENCOUNTER — Ambulatory Visit (HOSPITAL_COMMUNITY)
Admission: EM | Admit: 2018-06-09 | Discharge: 2018-06-09 | Disposition: A | Discharge status: Home / Self Care | Payer: 59 | Attending: Family Medicine | Admitting: Family Medicine

## 2018-06-09 DIAGNOSIS — R369 Urethral discharge, unspecified: Secondary | ICD-10-CM | POA: Insufficient documentation

## 2018-06-09 DIAGNOSIS — Z113 Encounter for screening for infections with a predominantly sexual mode of transmission: Secondary | ICD-10-CM | POA: Insufficient documentation

## 2018-06-09 DIAGNOSIS — R300 Dysuria: Secondary | ICD-10-CM | POA: Insufficient documentation

## 2018-06-09 DIAGNOSIS — Z711 Person with feared health complaint in whom no diagnosis is made: Secondary | ICD-10-CM

## 2018-06-09 DIAGNOSIS — F17210 Nicotine dependence, cigarettes, uncomplicated: Secondary | ICD-10-CM | POA: Insufficient documentation

## 2018-06-09 MED ORDER — CEFTRIAXONE SODIUM 250 MG IJ SOLR
INTRAMUSCULAR | Status: AC
  Filled 2018-06-09: qty 250

## 2018-06-09 MED ORDER — CEFTRIAXONE SODIUM 250 MG IJ SOLR
250.0000 mg | Freq: Once | INTRAMUSCULAR | Status: AC
  Administered 2018-06-09: 250 mg via INTRAMUSCULAR

## 2018-06-09 MED ORDER — AZITHROMYCIN 250 MG PO TABS
ORAL_TABLET | ORAL | Status: AC
Start: 2018-06-09 — End: ?
  Filled 2018-06-09: qty 4

## 2018-06-09 MED ORDER — AZITHROMYCIN 250 MG PO TABS
1000.0000 mg | ORAL_TABLET | Freq: Once | ORAL | Status: AC
  Administered 2018-06-09: 1000 mg via ORAL

## 2018-06-09 NOTE — ED Triage Notes (Signed)
Pt states hes noticed yellow discharge from his penis since yesterday. Denies pain at this time.

## 2018-06-09 NOTE — Discharge Instructions (Signed)
Given rocephin 250mg  injection and azithromycin 1g in office Urine cytology obtained We will follow up with you regarding the results of your test If tests are positive, please abstain from sexual activity for at least 7 days and notify partners Follow up with PCP if symptoms persists Return here or go to ER if you have any new or worsening symptoms

## 2018-06-09 NOTE — ED Provider Notes (Signed)
[facility]   [id number] 06/09/18 Arrival Time: 1005   CC: Penile discharge and dysuria  SUBJECTIVE:  Colin Thompson is a 27 y.o. male who presents requesting STI screening.  Last unprotected sexual encounter 2-3 days ago.  Sexually active with 1 male partner.   Reports yellow discharge from his penis and dysuria that began yesterday.  Reports similar symptoms in the past.  Denies fever, chills, nausea, vomiting, abdominal or pelvic pain, penile rashes or lesions, testicular swelling or pain.    No LMP for male patient.  ROS: As per HPI.  History reviewed. No pertinent past medical history. Past Surgical History:  Procedure Laterality Date  . FASCIOTOMY CLOSURE Left 08/13/2013   Procedure: FASCIOTOMY CLOSURE;  Surgeon: Sherren Kerns, MD;  Location: [facility] OR;  Service: Vascular;  Laterality: Left;  . FEMORAL-POPLITEAL BYPASS GRAFT Left 08/11/2013   Procedure: Repair of Left Popliteal Artery using Left Above Knee Popliteal to Above Knee Popliteal Bypass Graft with interpositional contralateral reverse vein graft; fourth compartment fasciotomy;  Surgeon: Sherren Kerns, MD;  Location: [facility] OR;  Service: Vascular;  Laterality: Left;  . INTRAOPERATIVE ARTERIOGRAM Left 08/11/2013   Procedure: INTRA OPERATIVE ARTERIOGRAM;  Surgeon: Sherren Kerns, MD;  Location: [facility] OR;  Service: Vascular;  Laterality: Left;   No Known Allergies No current facility-administered medications on file prior to encounter.    No current outpatient medications on file prior to encounter.   Social History   Socioeconomic History  . Marital status: Single    Spouse name: Not on file  . Number of children: Not on file  . Years of education: Not on file  . Highest education level: Not on file  Occupational History  . Not on file  Social Needs  . Financial resource strain: Not on file  . Food insecurity:    Worry: Not on file    Inability: Not on file  . Transportation needs:    Medical:  Not on file    Non-medical: Not on file  Tobacco Use  . Smoking status: Current Every Day Smoker    Packs/day: 0.50    Types: Cigarettes  . Smokeless tobacco: Never Used  . Tobacco comment: pt states that he has not been smoking alot lately but has not quit  Substance and Sexual Activity  . Alcohol use: Not Currently    Comment: occasionally  . Drug use: Yes    Types: Marijuana  . Sexual activity: Yes    Birth control/protection: Condom    Comment: occasional use  Lifestyle  . Physical activity:    Days per week: Not on file    Minutes per session: Not on file  . Stress: Not on file  Relationships  . Social connections:    Talks on phone: Not on file    Gets together: Not on file    Attends religious service: Not on file    Active member of club or organization: Not on file    Attends meetings of clubs or organizations: Not on file    Relationship status: Not on file  . Intimate partner violence:    Fear of current or ex partner: Not on file    Emotionally abused: Not on file    Physically abused: Not on file    Forced sexual activity: Not on file  Other Topics Concern  . Not on file  Social History Narrative  . Not on file   No family history on file.  OBJECTIVE:  Vitals:  06/09/18 1022  BP: 130/81  Pulse: 97  Resp: 16  Temp: (!) 97.1 F (36.2 C)  SpO2: 96%     General appearance: alert, cooperative, appears stated age and no distress Throat: lips, mucosa, and tongue normal; teeth and gums normal Lungs: CTA bilaterally without adventitious breath sounds Heart: regular rate and rhythm.  Radial pulses 2+ symmetrical bilaterally Back: no CVA tenderness Abdomen: soft, non-tender; bowel sounds normal; no guarding  GU: deferred Skin: warm and dry Psychological:  Alert and cooperative. Normal mood and affect.  ASSESSMENT & PLAN:  1. Dysuria   2. Penile discharge   3. Concern about STD in male without diagnosis     Meds ordered this encounter    Medications  . azithromycin (ZITHROMAX) tablet 1,000 mg  . cefTRIAXone (ROCEPHIN) injection 250 mg    Pending: Labs Reviewed  URINE CYTOLOGY ANCILLARY ONLY    Given rocephin 250mg  injection and azithromycin 1g in office Urine cytology obtained We will follow up with you regarding the results of your test If tests are positive, please abstain from sexual activity for at least 7 days and notify partners Follow up with PCP if symptoms persists Return here or go to ER if you have any new or worsening symptoms    Reviewed expectations re: course of current medical issues. Questions answered. Outlined signs and symptoms indicating need for more acute intervention. Patient verbalized understanding. After Visit Summary given.       Rennis Harding, PA-C 06/09/18 1045

## 2018-06-10 LAB — URINE CYTOLOGY ANCILLARY ONLY
Chlamydia: POSITIVE — AB
Neisseria Gonorrhea: POSITIVE — AB
TRICH (WINDOWPATH): NEGATIVE

## 2018-06-12 ENCOUNTER — Telehealth (HOSPITAL_COMMUNITY): Payer: Self-pay

## 2018-06-12 NOTE — Telephone Encounter (Signed)
Test for gonorrhea and chlamydia is positive. This was treated at the urgent care visit with IM rocephin 250mg  and po zithromax 1g. Need to educate patient to refrain from sexual intercourse for 7 days after treatment to give the medicine time to work. Sexual partners need to be notified and tested/treated. Condoms may reduce risk of reinfection. Recheck or followup with PCP for further evaluation if symptoms are not improving.  [facility] notified. Attempted to reach patient. No answer at this time.

## 2018-06-14 ENCOUNTER — Telehealth (HOSPITAL_COMMUNITY): Payer: Self-pay

## 2018-06-14 NOTE — Telephone Encounter (Signed)
Pt aware of results, educated on safe sex practices. Answered all questions.

## 2019-07-06 ENCOUNTER — Encounter (HOSPITAL_COMMUNITY): Payer: Self-pay | Admitting: *Deleted

## 2019-07-06 ENCOUNTER — Ambulatory Visit (HOSPITAL_COMMUNITY)
Admission: EM | Admit: 2019-07-06 | Discharge: 2019-07-06 | Disposition: A | Discharge status: Home / Self Care | Payer: Self-pay | Attending: Urgent Care | Admitting: Urgent Care

## 2019-07-06 ENCOUNTER — Other Ambulatory Visit: Payer: Self-pay

## 2019-07-06 DIAGNOSIS — R369 Urethral discharge, unspecified: Secondary | ICD-10-CM | POA: Insufficient documentation

## 2019-07-06 MED ORDER — AZITHROMYCIN 250 MG PO TABS
ORAL_TABLET | ORAL | Status: AC
  Filled 2019-07-06: qty 4

## 2019-07-06 MED ORDER — CEFTRIAXONE SODIUM 250 MG IJ SOLR
250.0000 mg | Freq: Once | INTRAMUSCULAR | Status: AC
  Administered 2019-07-06: 11:00:00 250 mg via INTRAMUSCULAR

## 2019-07-06 MED ORDER — CEFTRIAXONE SODIUM 250 MG IJ SOLR
INTRAMUSCULAR | Status: AC
  Filled 2019-07-06: qty 250

## 2019-07-06 MED ORDER — AZITHROMYCIN 250 MG PO TABS
1000.0000 mg | ORAL_TABLET | Freq: Once | ORAL | Status: AC
  Administered 2019-07-06: 11:00:00 1000 mg via ORAL

## 2019-07-06 NOTE — ED Provider Notes (Signed)
  MRN: [id number] DOB: [birth year]  Subjective:   Colin Thompson is a 28 y.o. male presenting for acute onset of penile discharge and mild dysuria.  Patient has been having sex without condoms.  He does have a girlfriend but has been cheating on her, states that he has been had sex with his girlfriend in about 2 weeks.  He plans on letting her know about his current symptoms that she can get tested and treated.  Denies taking any current medications.   No Known Allergies  History reviewed. No pertinent past medical history.   Past Surgical History:  Procedure Laterality Date  . FASCIOTOMY CLOSURE Left 08/13/2013   Procedure: FASCIOTOMY CLOSURE;  Surgeon: Elam Dutch, MD;  Location: [facility];  Service: Vascular;  Laterality: Left;  . FEMORAL-POPLITEAL BYPASS GRAFT Left 08/11/2013   Procedure: Repair of Left Popliteal Artery using Left Above Knee Popliteal to Above Knee Popliteal Bypass Graft with interpositional contralateral reverse vein graft; fourth compartment fasciotomy;  Surgeon: Elam Dutch, MD;  Location: [facility] OR;  Service: Vascular;  Laterality: Left;  . INTRAOPERATIVE ARTERIOGRAM Left 08/11/2013   Procedure: INTRA OPERATIVE ARTERIOGRAM;  Surgeon: Elam Dutch, MD;  Location: [facility] OR;  Service: Vascular;  Laterality: Left;    ROS Denies hematuria, urinary frequency, penile swelling, testicular pain, testicular swelling, anal pain, groin pain.   Objective:   Vitals: BP 130/75   Pulse 88   Temp 98.6 F (37 C) (Oral)   Resp 16   SpO2 98%   Physical Exam Constitutional:      Appearance: Normal appearance. He is well-developed and normal weight.  HENT:     Head: Normocephalic and atraumatic.     Right Ear: External ear normal.     Left Ear: External ear normal.     Nose: Nose normal.     Mouth/Throat:     Pharynx: Oropharynx is clear.  Eyes:     Extraocular Movements: Extraocular movements intact.     Pupils: Pupils are equal, round, and reactive to light.   Cardiovascular:     Rate and Rhythm: Normal rate.  Pulmonary:     Effort: Pulmonary effort is normal.  Genitourinary:    Penis: Circumcised. No phimosis, paraphimosis, hypospadias, erythema, tenderness, discharge, swelling or lesions.   Neurological:     Mental Status: He is alert and oriented to person, place, and time.  Psychiatric:        Mood and Affect: Mood normal.        Behavior: Behavior normal.     Assessment and Plan :   1. Penile discharge     We will treat empirically per CDC guidelines for gonorrhea and chlamydia with IM ceftriaxone and azithromycin in clinic.  Counseled on safe sex practices including abstaining for 1 week following treatment.  Counseled patient on potential for adverse effects with medications prescribed/recommended today, ER and return-to-clinic precautions discussed, patient verbalized understanding.    Jaynee Eagles, PA-C 07/06/19 1042

## 2019-07-06 NOTE — Discharge Instructions (Addendum)
Avoid all forms of sexual intercourse (oral, vaginal, anal) for the next 7 days to avoid spreading/reinfecting. Return if symptoms worsen/do not resolve, you develop fever, abdominal pain, blood in your urine, or are re-exposed to an STI.  

## 2019-07-06 NOTE — ED Triage Notes (Signed)
Reports starting with yellowish penile discharge with minor discomfort this AM.  Denies fevers.

## 2019-07-08 LAB — CYTOLOGY, (ORAL, ANAL, URETHRAL) ANCILLARY ONLY
Chlamydia: POSITIVE — AB
Neisseria Gonorrhea: POSITIVE — AB
Trichomonas: POSITIVE — AB

## 2019-07-09 ENCOUNTER — Other Ambulatory Visit (HOSPITAL_COMMUNITY): Payer: Self-pay | Admitting: Urgent Care

## 2019-07-09 MED ORDER — METRONIDAZOLE 500 MG PO TABS: 500.0000 mg | ORAL_TABLET | Freq: Two times a day (BID) | ORAL | 0 refills | Status: DC

## 2019-07-10 ENCOUNTER — Telehealth (HOSPITAL_COMMUNITY): Payer: Self-pay | Admitting: Emergency Medicine

## 2019-07-10 NOTE — Telephone Encounter (Signed)
Test for gonorrhea was positive. This was treated at the urgent care visit with IM rocephin 250mg  and po zithromax 1g. Pt needs education to refrain from sexual intercourse for 7 days after treatment to give the medicine time to work. Sexual partners need to be notified and tested/treated. Condoms may reduce risk of reinfection. Recheck or followup with PCP for further evaluation if symptoms are not improving. [facility] notified.   Chlamydia is positive.  This was treated at the urgent care visit with po zithromax 1g.  Pt needs education to please refrain from sexual intercourse for 7 days to give the medicine time to work.  Sexual partners need to be notified and tested/treated.  Condoms may reduce risk of reinfection.  Recheck or followup with PCP for further evaluation if symptoms are not improving.  [facility] notified.  Trichomonas is positive. Rx  for Flagyl was sent to the pharmacy of record. Pt needs education to refrain from sexual intercourse for 7 days to give the medicine time to work. Sexual partners need to be notified and tested/treated. Condoms may reduce risk of reinfection. Recheck for further evaluation if symptoms are not improving.    Attempted to reach patient, no answer, number appears to not be correct. Letter sent to patient.

## 2020-02-04 ENCOUNTER — Emergency Department (HOSPITAL_COMMUNITY): Admission: EM | Admit: 2020-02-04 | Discharge: 2020-02-04 | Discharge status: Against Medical Advice | Payer: Self-pay

## 2020-02-04 NOTE — ED Notes (Signed)
Pt stood up and walked out of triage, when asked where he was going pt said "im leaving"

## 2020-02-09 ENCOUNTER — Encounter (HOSPITAL_COMMUNITY): Payer: Self-pay

## 2020-02-09 ENCOUNTER — Other Ambulatory Visit: Payer: Self-pay

## 2020-02-09 ENCOUNTER — Ambulatory Visit (HOSPITAL_COMMUNITY)
Admission: EM | Admit: 2020-02-09 | Discharge: 2020-02-09 | Disposition: A | Discharge status: Home / Self Care | Payer: Self-pay | Attending: Physician Assistant | Admitting: Physician Assistant

## 2020-02-09 DIAGNOSIS — N342 Other urethritis: Secondary | ICD-10-CM | POA: Insufficient documentation

## 2020-02-09 MED ORDER — CEFTRIAXONE SODIUM 500 MG IJ SOLR
INTRAMUSCULAR | Status: AC
  Filled 2020-02-09: qty 500

## 2020-02-09 MED ORDER — DOXYCYCLINE HYCLATE 100 MG PO CAPS: 100.0000 mg | ORAL_CAPSULE | Freq: Two times a day (BID) | ORAL | 0 refills | Status: AC

## 2020-02-09 MED ORDER — CEFTRIAXONE SODIUM 500 MG IJ SOLR
500.0000 mg | Freq: Once | INTRAMUSCULAR | Status: AC
  Administered 2020-02-09: 500 mg via INTRAMUSCULAR

## 2020-02-09 NOTE — ED Provider Notes (Signed)
[facility]    CSN: [id number] Arrival date & time: 02/09/20  5885      History   Chief Complaint Chief Complaint  Patient presents with  . S74.5  . Penile Discharge    HPI Colin Thompson is a 29 y.o. male.   Patient reports urgent care for penile discharge.  He would like STI testing.  He reports he had unprotected sex 2 to 3 days ago.  Discharge started this morning.  He does note a little bit of discomfort with urination.  Patient notes history of having STIs and this is confirmed chart review November 2020, October 2019 and April 2019.  He has not noticed any rashes or lesions in his genitalia area.  No testicular pain or swelling.  No groin pain or swelling.  No fevers or chills.  Patient declines blood work testing for HIV or syphilis today.     History reviewed. No pertinent past medical history.  Patient Active Problem List   Diagnosis Date Noted  . 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 FASCIOTOMY CLOSURE Left 08/13/2013   Procedure: FASCIOTOMY CLOSURE;  Surgeon: Sherren Kerns, MD;  Location: [facility] OR;  Service: Vascular;  Laterality: Left;  . FEMORAL-POPLITEAL BYPASS GRAFT Left 08/11/2013   Procedure: Repair of Left Popliteal Artery using Left Above Knee Popliteal to Above Knee Popliteal Bypass Graft with interpositional contralateral reverse vein graft; fourth compartment fasciotomy;  Surgeon: Sherren Kerns, MD;  Location: [facility] OR;  Service: Vascular;  Laterality: Left;  . INTRAOPERATIVE ARTERIOGRAM Left 08/11/2013   Procedure: INTRA OPERATIVE ARTERIOGRAM;  Surgeon: Sherren Kerns, MD;  Location: [facility] OR;  Service: Vascular;  Laterality: Left;       Home Medications    Prior to Admission medications   Medication Sig Start Date End Date Taking? Authorizing Provider  doxycycline (VIBRAMYCIN) 100 MG capsule Take 1 capsule (100 mg total) by mouth 2 (two)  times daily for 7 days. 02/09/20 02/16/20  Ulysses Alper, Veryl Speak, PA-C  metroNIDAZOLE (FLAGYL) 500 MG tablet Take 1 tablet (500 mg total) by mouth 2 (two) times daily with a meal. DO NOT CONSUME ALCOHOL WHILE TAKING THIS MEDICATION. 07/09/19   Wallis Bamberg, PA-C    Family History Family History  Problem Relation Age of Onset  . Healthy Mother   . Healthy Father     Social History Social History   Tobacco Use  . Smoking status: Current Every Day Smoker    Packs/day: 0.50    Types: Cigarettes  . Smokeless tobacco: Never Used  . Tobacco comment: pt states that he has not been smoking alot lately but has not quit  Vaping Use  . Vaping Use: Never used  Substance Use Topics  . Alcohol use: Yes    Comment: occasionally  . Drug use: Yes    Types: Marijuana     Allergies   Patient has no known allergies.   Review of Systems Review of Systems   Physical Exam Triage Vital Signs ED Triage Vitals  Enc Vitals Group     BP 02/09/20 0817 125/79     Pulse Rate 02/09/20 0817 84     Resp 02/09/20 0817 17     Temp 02/09/20 0817 98.4 F (36.9 C)     Temp Source 02/09/20 0817 Oral     SpO2 02/09/20 0817 96 %     Weight 02/09/20 0823 251 lb  9.6 oz (114.1 kg)     Height --      Head Circumference --      Peak Flow --      Pain Score 02/09/20 0816 0     Pain Loc --      Pain Edu? --      Excl. in [place]? --    No data found.  Updated Vital Signs BP 125/79 (BP Location: Right Arm)   Pulse 84   Temp 98.4 F (36.9 C) (Oral)   Resp 17   Wt 251 lb 9.6 oz (114.1 kg)   SpO2 96%   BMI 34.12 kg/m   Visual Acuity Right Eye Distance:   Left Eye Distance:   Bilateral Distance:    Right Eye Near:   Left Eye Near:    Bilateral Near:     Physical Exam Vitals and nursing note reviewed.  Constitutional:      Appearance: Normal appearance.  Cardiovascular:     Rate and Rhythm: Normal rate.  Pulmonary:     Effort: Pulmonary effort is normal. No respiratory distress.  Genitourinary:     Testes: Normal.     Comments: Scant discharge noticed at the urethral meatus.  No lesions or rashes of the penis or surrounding skin.  Testicles without swelling or tenderness.  No groin swelling or tenderness Musculoskeletal:     Right lower leg: No edema.     Left lower leg: No edema.  Neurological:     Mental Status: He is alert.      UC Treatments / Results  Labs (all labs ordered are listed, but only abnormal results are displayed) Labs Reviewed  CYTOLOGY, (ORAL, ANAL, URETHRAL) ANCILLARY ONLY    EKG   Radiology No results found.  Procedures Procedures (including critical care time)  Medications Ordered in UC Medications  cefTRIAXone (ROCEPHIN) injection 500 mg (500 mg Intramuscular Given 02/09/20 0846)    Initial Impression / Assessment and Plan / UC Course  I have reviewed the triage vital signs and the nursing notes.  Pertinent labs & imaging results that were available during my care of the patient were reviewed by me and considered in my medical decision making (see chart for details).     #Urethritis Patient is a 29 year old with remote history of gonorrhea, chlamydia and trichomonas infections presenting with urethral discharge today.  Given numerous episodes in the past will treat today with Rocephin and send out with doxycycline prescription.  Patient declined HIV and syphilis testing.  Recommended he have these done either with this clinic or the health department.  Safe sex discussed.  Discussed side effects of doxycycline and how to properly take the medicine.  Patient verbalized understanding plan -Urethral swab sent. Final Clinical Impressions(s) / UC Diagnoses   Final diagnoses:  Urethritis     Discharge Instructions     Take the doxycycline as prescribed 2 times a day for 7 days, drink atleast 8 ounces of water with this. Do not lie down for 30 minutes following  No sex until all labs and treatments complete  Always use condoms  I recommend  blood work for HIV at some point      ED Prescriptions    Medication [facility]. Provider   doxycycline (VIBRAMYCIN) 100 MG capsule Take 1 capsule (100 mg total) by mouth 2 (two) times daily for 7 days. 14 capsule Colin Bennetts, Marguerita Beards, PA-C     PDMP not reviewed this encounter.   Keyonta Barradas, Marguerita Beards, PA-C  02/09/20 0848  

## 2020-02-09 NOTE — ED Triage Notes (Signed)
Pt is here with penile discharge that started this morning, pt last had unprotected sex 2 days ago. Pt wants STD testing today.

## 2020-02-09 NOTE — Discharge Instructions (Signed)
Take the doxycycline as prescribed 2 times a day for 7 days, drink atleast 8 ounces of water with this. Do not lie down for 30 minutes following  No sex until all labs and treatments complete  Always use condoms  I recommend blood work for HIV at some point

## 2020-02-10 ENCOUNTER — Telehealth (HOSPITAL_COMMUNITY): Payer: Self-pay | Admitting: Orthopedic Surgery

## 2020-02-10 LAB — CYTOLOGY, (ORAL, ANAL, URETHRAL) ANCILLARY ONLY
Chlamydia: NEGATIVE
Comment: NEGATIVE
Comment: NEGATIVE
Comment: NORMAL
Neisseria Gonorrhea: POSITIVE — AB
Trichomonas: POSITIVE — AB

## 2020-02-10 MED ORDER — METRONIDAZOLE 500 MG PO TABS: 500.0000 mg | ORAL_TABLET | Freq: Two times a day (BID) | ORAL | 0 refills | Status: DC

## 2020-03-10 ENCOUNTER — Encounter (HOSPITAL_COMMUNITY): Payer: Self-pay

## 2020-03-10 ENCOUNTER — Emergency Department (HOSPITAL_COMMUNITY)
Admission: EM | Admit: 2020-03-10 | Discharge: 2020-03-11 | Disposition: A | Discharge status: Home / Self Care | Payer: Self-pay | Attending: Emergency Medicine | Admitting: Emergency Medicine

## 2020-03-10 DIAGNOSIS — L600 Ingrowing nail: Secondary | ICD-10-CM | POA: Insufficient documentation

## 2020-03-10 DIAGNOSIS — Z5321 Procedure and treatment not carried out due to patient leaving prior to being seen by health care provider: Secondary | ICD-10-CM | POA: Insufficient documentation

## 2020-03-10 NOTE — ED Notes (Signed)
Pt informed registration that he was leaving.

## 2020-03-10 NOTE — ED Triage Notes (Signed)
Pt arrives POV for eval of ingrown toenail to R foot x 1 month

## 2020-03-17 ENCOUNTER — Emergency Department (HOSPITAL_COMMUNITY)
Admission: EM | Admit: 2020-03-17 | Discharge: 2020-03-17 | Disposition: A | Discharge status: Home / Self Care | Payer: Self-pay | Attending: Emergency Medicine | Admitting: Emergency Medicine

## 2020-03-17 ENCOUNTER — Encounter (HOSPITAL_COMMUNITY): Payer: Self-pay | Admitting: Emergency Medicine

## 2020-03-17 DIAGNOSIS — R1111 Vomiting without nausea: Secondary | ICD-10-CM | POA: Diagnosis not present

## 2020-03-17 DIAGNOSIS — R350 Frequency of micturition: Secondary | ICD-10-CM | POA: Insufficient documentation

## 2020-03-17 DIAGNOSIS — R109 Unspecified abdominal pain: Secondary | ICD-10-CM | POA: Insufficient documentation

## 2020-03-17 DIAGNOSIS — R112 Nausea with vomiting, unspecified: Secondary | ICD-10-CM | POA: Insufficient documentation

## 2020-03-17 DIAGNOSIS — R52 Pain, unspecified: Secondary | ICD-10-CM | POA: Diagnosis not present

## 2020-03-17 DIAGNOSIS — I10 Essential (primary) hypertension: Secondary | ICD-10-CM | POA: Diagnosis not present

## 2020-03-17 DIAGNOSIS — R1084 Generalized abdominal pain: Secondary | ICD-10-CM | POA: Diagnosis not present

## 2020-03-17 DIAGNOSIS — Z5321 Procedure and treatment not carried out due to patient leaving prior to being seen by health care provider: Secondary | ICD-10-CM | POA: Insufficient documentation

## 2020-03-17 DIAGNOSIS — R110 Nausea: Secondary | ICD-10-CM | POA: Diagnosis not present

## 2020-03-17 LAB — COMPREHENSIVE METABOLIC PANEL
ALT: 53 U/L — ABNORMAL HIGH (ref 0–44)
AST: 36 U/L (ref 15–41)
Albumin: 4.2 g/dL (ref 3.5–5.0)
Alkaline Phosphatase: 53 U/L (ref 38–126)
Anion gap: 9 (ref 5–15)
BUN: 11 mg/dL (ref 6–20)
CO2: 24 mmol/L (ref 22–32)
Calcium: 9.1 mg/dL (ref 8.9–10.3)
Chloride: 106 mmol/L (ref 98–111)
Creatinine, Ser: 1.25 mg/dL — ABNORMAL HIGH (ref 0.61–1.24)
GFR calc Af Amer: >60 mL/min (ref >60)
GFR calc non Af Amer: >60 mL/min (ref >60)
Glucose, Bld: 144 mg/dL — ABNORMAL HIGH (ref 70–99)
Potassium: 3.6 mmol/L (ref 3.5–5.1)
Sodium: 139 mmol/L (ref 135–145)
Total Bilirubin: 0.9 mg/dL (ref 0.3–1.2)
Total Protein: 6.9 g/dL (ref 6.5–8.1)

## 2020-03-17 LAB — CBC
HCT: 45.5 % (ref 39.0–52.0)
Hemoglobin: 15.3 g/dL (ref 13.0–17.0)
MCH: 30.9 pg (ref 26.0–34.0)
MCHC: 33.6 g/dL (ref 30.0–36.0)
MCV: 91.9 fL (ref 80.0–100.0)
Platelets: 263 10*3/uL (ref 150–400)
RBC: 4.95 MIL/uL (ref 4.22–5.81)
RDW: 12.8 % (ref 11.5–15.5)
WBC: 9 10*3/uL (ref 4.0–10.5)
nRBC: 0 % (ref 0.0–0.2)

## 2020-03-17 LAB — LIPASE, BLOOD: Lipase: 24 U/L (ref 11–51)

## 2020-03-17 MED ORDER — SODIUM CHLORIDE 0.9% FLUSH: 3.0000 mL | Freq: Once | INTRAVENOUS | Status: DC

## 2020-03-17 NOTE — ED Notes (Signed)
Pt called x3 for vital recheck no answer

## 2020-03-17 NOTE — ED Triage Notes (Signed)
Pt here from home with c/o abd pain and n/v along with some freq urination

## 2020-08-17 ENCOUNTER — Encounter (HOSPITAL_COMMUNITY): Payer: Self-pay | Admitting: Emergency Medicine

## 2020-08-17 ENCOUNTER — Other Ambulatory Visit: Payer: Self-pay

## 2020-08-17 ENCOUNTER — Emergency Department (HOSPITAL_COMMUNITY): Payer: Self-pay

## 2020-08-17 ENCOUNTER — Inpatient Hospital Stay (HOSPITAL_COMMUNITY)
Admission: EM | Admit: 2020-08-17 | Discharge: 2020-08-22 | DRG: 959 | Disposition: A | Discharge status: Home / Self Care | Payer: Self-pay | Attending: General Surgery | Admitting: General Surgery

## 2020-08-17 ENCOUNTER — Encounter (HOSPITAL_COMMUNITY): Admission: EM | Disposition: A | Discharge status: Home / Self Care | Payer: Self-pay | Source: Home / Self Care

## 2020-08-17 DIAGNOSIS — T1490XA Injury, unspecified, initial encounter: Secondary | ICD-10-CM

## 2020-08-17 DIAGNOSIS — W3400XA Accidental discharge from unspecified firearms or gun, initial encounter: Secondary | ICD-10-CM

## 2020-08-17 DIAGNOSIS — S3609XA Other injury of spleen, initial encounter: Secondary | ICD-10-CM | POA: Diagnosis present

## 2020-08-17 DIAGNOSIS — Z23 Encounter for immunization: Secondary | ICD-10-CM

## 2020-08-17 DIAGNOSIS — R52 Pain, unspecified: Secondary | ICD-10-CM | POA: Diagnosis not present

## 2020-08-17 DIAGNOSIS — Z20822 Contact with and (suspected) exposure to covid-19: Secondary | ICD-10-CM | POA: Diagnosis present

## 2020-08-17 DIAGNOSIS — I10 Essential (primary) hypertension: Secondary | ICD-10-CM | POA: Diagnosis present

## 2020-08-17 DIAGNOSIS — R61 Generalized hyperhidrosis: Secondary | ICD-10-CM | POA: Diagnosis present

## 2020-08-17 DIAGNOSIS — S27808A Other injury of diaphragm, initial encounter: Principal | ICD-10-CM | POA: Diagnosis present

## 2020-08-17 DIAGNOSIS — R0902 Hypoxemia: Secondary | ICD-10-CM | POA: Diagnosis not present

## 2020-08-17 DIAGNOSIS — F17210 Nicotine dependence, cigarettes, uncomplicated: Secondary | ICD-10-CM | POA: Diagnosis present

## 2020-08-17 DIAGNOSIS — Z9689 Presence of other specified functional implants: Secondary | ICD-10-CM

## 2020-08-17 DIAGNOSIS — Z9889 Other specified postprocedural states: Secondary | ICD-10-CM

## 2020-08-17 DIAGNOSIS — F1290 Cannabis use, unspecified, uncomplicated: Secondary | ICD-10-CM | POA: Diagnosis present

## 2020-08-17 DIAGNOSIS — R339 Retention of urine, unspecified: Secondary | ICD-10-CM | POA: Diagnosis present

## 2020-08-17 HISTORY — PX: GASTROSTOMY: SHX151

## 2020-08-17 HISTORY — PX: CHEST TUBE INSERTION: SHX231

## 2020-08-17 HISTORY — PX: CLOSURE OF DIAPHRAGM: SHX5777

## 2020-08-17 HISTORY — PX: LAPAROTOMY: SHX154

## 2020-08-17 LAB — I-STAT CHEM 8, ED
BUN: 15 mg/dL (ref 6–20)
Calcium, Ion: 1.07 mmol/L — ABNORMAL LOW (ref 1.15–1.40)
Chloride: 105 mmol/L (ref 98–111)
Creatinine, Ser: 1 mg/dL (ref 0.61–1.24)
Glucose, Bld: 153 mg/dL — ABNORMAL HIGH (ref 70–99)
HCT: 47 % (ref 39.0–52.0)
Hemoglobin: 16 g/dL (ref 13.0–17.0)
Potassium: 3.3 mmol/L — ABNORMAL LOW (ref 3.5–5.1)
Sodium: 140 mmol/L (ref 135–145)
TCO2: 22 mmol/L (ref 22–32)

## 2020-08-17 LAB — COMPREHENSIVE METABOLIC PANEL
ALT: 45 U/L — ABNORMAL HIGH (ref 0–44)
AST: 33 U/L (ref 15–41)
Albumin: 4.3 g/dL (ref 3.5–5.0)
Alkaline Phosphatase: 51 U/L (ref 38–126)
Anion gap: 12 (ref 5–15)
BUN: 13 mg/dL (ref 6–20)
CO2: 21 mmol/L — ABNORMAL LOW (ref 22–32)
Calcium: 8.8 mg/dL — ABNORMAL LOW (ref 8.9–10.3)
Chloride: 102 mmol/L (ref 98–111)
Creatinine, Ser: 1.18 mg/dL (ref 0.61–1.24)
GFR, Estimated: >60 mL/min (ref >60)
Glucose, Bld: 152 mg/dL — ABNORMAL HIGH (ref 70–99)
Potassium: 3.3 mmol/L — ABNORMAL LOW (ref 3.5–5.1)
Sodium: 135 mmol/L (ref 135–145)
Total Bilirubin: 0.9 mg/dL (ref 0.3–1.2)
Total Protein: 6.8 g/dL (ref 6.5–8.1)

## 2020-08-17 LAB — SAMPLE TO BLOOD BANK

## 2020-08-17 LAB — CBC
HCT: 45.3 % (ref 39.0–52.0)
Hemoglobin: 15 g/dL (ref 13.0–17.0)
MCH: 31.4 pg (ref 26.0–34.0)
MCHC: 33.1 g/dL (ref 30.0–36.0)
MCV: 95 fL (ref 80.0–100.0)
Platelets: 258 10*3/uL (ref 150–400)
RBC: 4.77 MIL/uL (ref 4.22–5.81)
RDW: 12.9 % (ref 11.5–15.5)
WBC: 9.5 10*3/uL (ref 4.0–10.5)
nRBC: 0 % (ref 0.0–0.2)

## 2020-08-17 LAB — PROTIME-INR
INR: 1 (ref 0.8–1.2)
Prothrombin Time: 13 seconds (ref 11.4–15.2)

## 2020-08-17 LAB — ETHANOL: Alcohol, Ethyl (B): <10 mg/dL (ref <10)

## 2020-08-17 LAB — LACTIC ACID, PLASMA: Lactic Acid, Venous: 2.2 mmol/L (ref 0.5–1.9)

## 2020-08-17 SURGERY — CLOSURE, DIAPHRAGM
Anesthesia: General | Site: Chest

## 2020-08-17 MED ORDER — CEFAZOLIN SODIUM-DEXTROSE 2-4 GM/100ML-% IV SOLN
2.0000 g | Freq: Once | INTRAVENOUS | Status: AC
  Administered 2020-08-17: 2 g via INTRAVENOUS
  Filled 2020-08-17: qty 100

## 2020-08-17 MED ORDER — SODIUM CHLORIDE 0.9 % IV SOLN
INTRAVENOUS | Status: AC | PRN
  Administered 2020-08-17: 1000 mL via INTRAVENOUS

## 2020-08-17 MED ORDER — MIDAZOLAM HCL 2 MG/2ML IJ SOLN
INTRAMUSCULAR | Status: AC
  Filled 2020-08-17: qty 2

## 2020-08-17 MED ORDER — IOHEXOL 300 MG/ML  SOLN
100.0000 mL | Freq: Once | INTRAMUSCULAR | Status: AC | PRN
  Administered 2020-08-17: 23:00:00 100 mL via INTRAVENOUS

## 2020-08-17 MED ORDER — TETANUS-DIPHTH-ACELL PERTUSSIS 5-2.5-18.5 LF-MCG/0.5 IM SUSY
0.5000 mL | PREFILLED_SYRINGE | Freq: Once | INTRAMUSCULAR | Status: AC
  Administered 2020-08-17: 23:00:00 0.5 mL via INTRAMUSCULAR
  Filled 2020-08-17: qty 0.5

## 2020-08-17 MED ORDER — HYDROMORPHONE HCL 1 MG/ML IJ SOLN
INTRAMUSCULAR | Status: AC | PRN
  Administered 2020-08-17: 1 mg via INTRAVENOUS

## 2020-08-17 MED ORDER — FENTANYL CITRATE (PF) 250 MCG/5ML IJ SOLN
INTRAMUSCULAR | Status: AC
  Filled 2020-08-17: qty 5

## 2020-08-17 MED ORDER — HYDROMORPHONE HCL 1 MG/ML IJ SOLN: 1.0000 mg | Freq: Once | INTRAMUSCULAR | Status: DC

## 2020-08-17 MED ORDER — PROPOFOL 10 MG/ML IV BOLUS
INTRAVENOUS | Status: AC
  Filled 2020-08-17: qty 20

## 2020-08-17 MED ORDER — HYDROMORPHONE HCL 1 MG/ML IJ SOLN
INTRAMUSCULAR | Status: AC
  Filled 2020-08-17: qty 1

## 2020-08-17 SURGICAL SUPPLY — 56 items
BENZOIN TINCTURE PRP APPL 2/3 (GAUZE/BANDAGES/DRESSINGS) ×6 IMPLANT
BLADE CLIPPER SURG (BLADE) ×6 IMPLANT
CANISTER SUCT 3000ML PPV (MISCELLANEOUS) ×6 IMPLANT
CHLORAPREP W/TINT 26 (MISCELLANEOUS) ×6 IMPLANT
COVER SURGICAL LIGHT HANDLE (MISCELLANEOUS) ×6 IMPLANT
COVER WAND RF STERILE (DRAPES) ×6 IMPLANT
DRAPE LAPAROSCOPIC ABDOMINAL (DRAPES) ×6 IMPLANT
DRAPE WARM FLUID 44X44 (DRAPES) ×6 IMPLANT
DRSG OPSITE POSTOP 4X10 (GAUZE/BANDAGES/DRESSINGS) IMPLANT
DRSG OPSITE POSTOP 4X8 (GAUZE/BANDAGES/DRESSINGS) IMPLANT
ELECT BLADE 6.5 EXT (BLADE) IMPLANT
ELECT CAUTERY BLADE 6.4 (BLADE) ×6 IMPLANT
ELECT REM PT RETURN 9FT ADLT (ELECTROSURGICAL) ×6
ELECTRODE REM PT RTRN 9FT ADLT (ELECTROSURGICAL) ×4 IMPLANT
GAUZE SPONGE 4X4 12PLY STRL LF (GAUZE/BANDAGES/DRESSINGS) ×6 IMPLANT
GLOVE BIO SURGEON STRL SZ8 (GLOVE) ×6 IMPLANT
GLOVE BIOGEL PI IND STRL 7.0 (GLOVE) ×4 IMPLANT
GLOVE BIOGEL PI IND STRL 7.5 (GLOVE) ×4 IMPLANT
GLOVE BIOGEL PI IND STRL 8 (GLOVE) ×4 IMPLANT
GLOVE BIOGEL PI INDICATOR 7.0 (GLOVE) ×2
GLOVE BIOGEL PI INDICATOR 7.5 (GLOVE) ×2
GLOVE BIOGEL PI INDICATOR 8 (GLOVE) ×2
GLOVE INDICATOR 7.0 STRL GRN (GLOVE) ×6 IMPLANT
GLOVE INDICATOR 7.5 STRL GRN (GLOVE) ×6 IMPLANT
GOWN STRL REUS W/ TWL LRG LVL3 (GOWN DISPOSABLE) ×4 IMPLANT
GOWN STRL REUS W/ TWL XL LVL3 (GOWN DISPOSABLE) ×4 IMPLANT
GOWN STRL REUS W/TWL LRG LVL3 (GOWN DISPOSABLE) ×2
GOWN STRL REUS W/TWL XL LVL3 (GOWN DISPOSABLE) ×2
HANDLE SUCTION POOLE (INSTRUMENTS) ×4 IMPLANT
KIT BASIN OR (CUSTOM PROCEDURE TRAY) ×6 IMPLANT
KIT TURNOVER KIT B (KITS) ×6 IMPLANT
LIGASURE IMPACT 36 18CM CVD LR (INSTRUMENTS) IMPLANT
NS IRRIG 1000ML POUR BTL (IV SOLUTION) ×12 IMPLANT
PACK GENERAL/GYN (CUSTOM PROCEDURE TRAY) ×6 IMPLANT
PAD ABD 8X10 STRL (GAUZE/BANDAGES/DRESSINGS) ×6 IMPLANT
PAD ARMBOARD 7.5X6 YLW CONV (MISCELLANEOUS) ×6 IMPLANT
PENCIL SMOKE EVACUATOR (MISCELLANEOUS) ×6 IMPLANT
RELOAD STAPLER LINE PROX 30 GR (STAPLE) ×4 IMPLANT
SPECIMEN JAR LARGE (MISCELLANEOUS) IMPLANT
SPONGE LAP 18X18 RF (DISPOSABLE) ×12 IMPLANT
STAPLER RELOAD LINE PROX 30 GR (STAPLE) ×6
STAPLER RELOADABLE 30 GRN THCK (STAPLE) ×4 IMPLANT
STAPLER VISISTAT 35W (STAPLE) ×6 IMPLANT
SUCTION POOLE HANDLE (INSTRUMENTS) ×6
SUT PDS AB 1 TP1 96 (SUTURE) ×12 IMPLANT
SUT PROLENE 0 CT 1 30 (SUTURE) ×6 IMPLANT
SUT SILK 2 0 SH (SUTURE) ×24 IMPLANT
SUT SILK 2 0 SH CR/8 (SUTURE) ×6 IMPLANT
SUT SILK 2 0 TIES 10X30 (SUTURE) ×6 IMPLANT
SUT SILK 3 0 SH CR/8 (SUTURE) ×6 IMPLANT
SUT SILK 3 0 TIES 10X30 (SUTURE) ×6 IMPLANT
SYSTEM SAHARA CHEST DRAIN ATS (WOUND CARE) ×6 IMPLANT
TAPE CLOTH SURG 4X10 WHT LF (GAUZE/BANDAGES/DRESSINGS) ×6 IMPLANT
TOWEL GREEN STERILE (TOWEL DISPOSABLE) ×6 IMPLANT
TRAY FOLEY MTR SLVR 16FR STAT (SET/KITS/TRAYS/PACK) ×6 IMPLANT
YANKAUER SUCT BULB TIP NO VENT (SUCTIONS) IMPLANT
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
## 2020-08-17 NOTE — ED Notes (Signed)
Patient transported to CT 
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
## 2020-08-18 ENCOUNTER — Encounter (HOSPITAL_COMMUNITY): Payer: Self-pay | Admitting: General Surgery

## 2020-08-18 ENCOUNTER — Inpatient Hospital Stay (HOSPITAL_COMMUNITY): Payer: Self-pay

## 2020-08-18 ENCOUNTER — Emergency Department (HOSPITAL_COMMUNITY): Payer: Self-pay | Admitting: Anesthesiology

## 2020-08-18 DIAGNOSIS — W3400XA Accidental discharge from unspecified firearms or gun, initial encounter: Secondary | ICD-10-CM

## 2020-08-18 DIAGNOSIS — R079 Chest pain, unspecified: Secondary | ICD-10-CM

## 2020-08-18 LAB — CBC
HCT: 42.9 % (ref 39.0–52.0)
Hemoglobin: 15.1 g/dL (ref 13.0–17.0)
MCH: 32.4 pg (ref 26.0–34.0)
MCHC: 35.2 g/dL (ref 30.0–36.0)
MCV: 92.1 fL (ref 80.0–100.0)
Platelets: 265 10*3/uL (ref 150–400)
RBC: 4.66 MIL/uL (ref 4.22–5.81)
RDW: 13 % (ref 11.5–15.5)
WBC: 18.3 10*3/uL — ABNORMAL HIGH (ref 4.0–10.5)
nRBC: 0 % (ref 0.0–0.2)

## 2020-08-18 LAB — ECHOCARDIOGRAM LIMITED
Height: 73 in
Weight: 3776 oz

## 2020-08-18 LAB — RESP PANEL BY RT-PCR (FLU A&B, COVID) ARPGX2
Influenza A by PCR: NEGATIVE
Influenza B by PCR: NEGATIVE
SARS Coronavirus 2 by RT PCR: NEGATIVE

## 2020-08-18 LAB — BASIC METABOLIC PANEL
Anion gap: 11 (ref 5–15)
BUN: 9 mg/dL (ref 6–20)
CO2: 23 mmol/L (ref 22–32)
Calcium: 9 mg/dL (ref 8.9–10.3)
Chloride: 103 mmol/L (ref 98–111)
Creatinine, Ser: 1.04 mg/dL (ref 0.61–1.24)
GFR, Estimated: >60 mL/min (ref >60)
Glucose, Bld: 124 mg/dL — ABNORMAL HIGH (ref 70–99)
Potassium: 4 mmol/L (ref 3.5–5.1)
Sodium: 137 mmol/L (ref 135–145)

## 2020-08-18 LAB — HIV ANTIBODY (ROUTINE TESTING W REFLEX): HIV Screen 4th Generation wRfx: NONREACTIVE

## 2020-08-18 MED ORDER — PROPOFOL 10 MG/ML IV BOLUS
INTRAVENOUS | Status: DC | PRN
  Administered 2020-08-18: 150 mg via INTRAVENOUS

## 2020-08-18 MED ORDER — PANTOPRAZOLE SODIUM 40 MG IV SOLR
40.0000 mg | Freq: Every day | INTRAVENOUS | Status: DC
  Administered 2020-08-18 – 2020-08-20 (×3): 40 mg via INTRAVENOUS
  Filled 2020-08-18 (×3): qty 40

## 2020-08-18 MED ORDER — 0.9 % SODIUM CHLORIDE (POUR BTL) OPTIME
TOPICAL | Status: DC | PRN
  Administered 2020-08-18: 01:00:00 1000 mL

## 2020-08-18 MED ORDER — LACTATED RINGERS IV SOLN: INTRAVENOUS | Status: DC | PRN

## 2020-08-18 MED ORDER — DEXMEDETOMIDINE HCL 200 MCG/2ML IV SOLN
INTRAVENOUS | Status: DC | PRN
  Administered 2020-08-18 (×2): 8 ug via INTRAVENOUS

## 2020-08-18 MED ORDER — ONDANSETRON HCL 4 MG/2ML IJ SOLN
INTRAMUSCULAR | Status: DC | PRN
  Administered 2020-08-18: 4 mg via INTRAVENOUS

## 2020-08-18 MED ORDER — LIDOCAINE HCL (CARDIAC) PF 100 MG/5ML IV SOSY
PREFILLED_SYRINGE | INTRAVENOUS | Status: DC | PRN
  Administered 2020-08-18: 100 mg via INTRAVENOUS

## 2020-08-18 MED ORDER — SUCCINYLCHOLINE 20MG/ML (10ML) SYRINGE FOR MEDFUSION PUMP - OPTIME
INTRAMUSCULAR | Status: DC | PRN
  Administered 2020-08-18: 140 mg via INTRAVENOUS

## 2020-08-18 MED ORDER — ENOXAPARIN SODIUM 30 MG/0.3ML ~~LOC~~ SOLN
30.0000 mg | Freq: Two times a day (BID) | SUBCUTANEOUS | Status: DC
  Administered 2020-08-20 – 2020-08-22 (×5): 30 mg via SUBCUTANEOUS
  Filled 2020-08-18 (×5): qty 0.3

## 2020-08-18 MED ORDER — METOPROLOL TARTRATE 5 MG/5ML IV SOLN
5.0000 mg | Freq: Four times a day (QID) | INTRAVENOUS | Status: DC | PRN
Start: 2020-08-18 — End: 2020-08-22

## 2020-08-18 MED ORDER — DIPHENHYDRAMINE HCL 50 MG/ML IJ SOLN: 12.5000 mg | Freq: Four times a day (QID) | INTRAMUSCULAR | Status: DC | PRN

## 2020-08-18 MED ORDER — ONDANSETRON HCL 4 MG/2ML IJ SOLN: 4.0000 mg | Freq: Four times a day (QID) | INTRAMUSCULAR | Status: DC | PRN

## 2020-08-18 MED ORDER — SUGAMMADEX SODIUM 200 MG/2ML IV SOLN
INTRAVENOUS | Status: DC | PRN
  Administered 2020-08-18: 200 mg via INTRAVENOUS

## 2020-08-18 MED ORDER — ALBUMIN HUMAN 5 % IV SOLN: INTRAVENOUS | Status: DC | PRN

## 2020-08-18 MED ORDER — HYDROMORPHONE HCL 1 MG/ML IJ SOLN
INTRAMUSCULAR | Status: AC
  Filled 2020-08-18: qty 1

## 2020-08-18 MED ORDER — NALOXONE HCL 0.4 MG/ML IJ SOLN: 0.4000 mg | INTRAMUSCULAR | Status: DC | PRN

## 2020-08-18 MED ORDER — HYDROMORPHONE 1 MG/ML IV SOLN
INTRAVENOUS | Status: DC
  Administered 2020-08-18: 0.9 mg via INTRAVENOUS
  Administered 2020-08-19: 6.3 mg via INTRAVENOUS
  Administered 2020-08-19: 30 mg via INTRAVENOUS
  Administered 2020-08-20: 2.1 mg via INTRAVENOUS
  Administered 2020-08-20: 1.8 mg via INTRAVENOUS
  Administered 2020-08-20: 3.6 mg via INTRAVENOUS
  Filled 2020-08-18 (×2): qty 30

## 2020-08-18 MED ORDER — FENTANYL CITRATE (PF) 250 MCG/5ML IJ SOLN
INTRAMUSCULAR | Status: DC | PRN
  Administered 2020-08-18: 150 ug via INTRAVENOUS
  Administered 2020-08-18: 100 ug via INTRAVENOUS

## 2020-08-18 MED ORDER — DIPHENHYDRAMINE HCL 12.5 MG/5ML PO ELIX: 12.5000 mg | ORAL_SOLUTION | Freq: Four times a day (QID) | ORAL | Status: DC | PRN

## 2020-08-18 MED ORDER — POTASSIUM CHLORIDE IN NACL 20-0.9 MEQ/L-% IV SOLN
INTRAVENOUS | Status: DC
  Filled 2020-08-18 (×7): qty 1000

## 2020-08-18 MED ORDER — METHOCARBAMOL 1000 MG/10ML IJ SOLN
500.0000 mg | Freq: Four times a day (QID) | INTRAVENOUS | Status: DC | PRN
  Filled 2020-08-18: qty 5

## 2020-08-18 MED ORDER — ROCURONIUM 10MG/ML (10ML) SYRINGE FOR MEDFUSION PUMP - OPTIME
INTRAVENOUS | Status: DC | PRN
  Administered 2020-08-18: 60 mg via INTRAVENOUS
  Administered 2020-08-18: 20 mg via INTRAVENOUS

## 2020-08-18 MED ORDER — PANTOPRAZOLE SODIUM 40 MG PO TBEC
40.0000 mg | DELAYED_RELEASE_TABLET | Freq: Every day | ORAL | Status: DC
  Administered 2020-08-21 – 2020-08-22 (×2): 40 mg via ORAL
  Filled 2020-08-18 (×2): qty 1

## 2020-08-18 MED ORDER — HYDROMORPHONE 1 MG/ML IV SOLN
INTRAVENOUS | Status: AC
  Administered 2020-08-18: 30 mg
  Filled 2020-08-18: qty 30

## 2020-08-18 MED ORDER — SODIUM CHLORIDE 0.9% FLUSH: 9.0000 mL | INTRAVENOUS | Status: DC | PRN

## 2020-08-18 MED ORDER — HYDROMORPHONE HCL 1 MG/ML IJ SOLN
0.2500 mg | INTRAMUSCULAR | Status: DC | PRN
  Administered 2020-08-18: 0.5 mg via INTRAVENOUS

## 2020-08-18 MED ORDER — MIDAZOLAM HCL 2 MG/2ML IJ SOLN
INTRAMUSCULAR | Status: DC | PRN
  Administered 2020-08-18: 2 mg via INTRAVENOUS

## 2020-08-18 MED ORDER — PROMETHAZINE HCL 25 MG/ML IJ SOLN: 6.2500 mg | INTRAMUSCULAR | Status: DC | PRN
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
## 2020-08-18 NOTE — Anesthesia Postprocedure Evaluation (Signed)
Anesthesia Post Note  Patient: Colin Thompson  Procedure(s) Performed: EXPLORATORY LAPAROTOMY (N/A ) GASTROSTOMY TIMES TWO (Abdomen) REPAIR OF DIAPHRAGM (N/A Abdomen) CHEST TUBE INSERTION (Left Chest)     Patient location during evaluation: PACU Anesthesia Type: General Level of consciousness: sedated Pain management: pain level controlled Vital Signs Assessment: post-procedure vital signs reviewed and stable Respiratory status: spontaneous breathing and respiratory function stable Cardiovascular status: stable Postop Assessment: no apparent nausea or vomiting Anesthetic complications: no   No complications documented.  Last Vitals:  Vitals:   08/18/20 0324 08/18/20 0340  BP: (!) 141/65   Pulse: 100   Resp: (!) 28 (!) 28  Temp: 37.1 C   SpO2: 94% 97%    Last Pain:  Vitals:   08/18/20 0340  TempSrc:   PainSc: 5                  Mitzie Marlar DANIEL

## 2020-08-18 NOTE — Progress Notes (Signed)
Echocardiogram 2D Echocardiogram has been performed.  Colin Thompson 08/18/2020, 8:02 AM

## 2020-08-18 NOTE — Anesthesia Procedure Notes (Signed)
Procedure Name: Intubation Date/Time: 08/18/2020 12:28 AM Performed by: Molli Hazard, CRNA Pre-anesthesia Checklist: Patient identified, Emergency Drugs available, Suction available and Patient being monitored Patient Re-evaluated:Patient Re-evaluated prior to induction Oxygen Delivery Method: Circle system utilized Preoxygenation: Pre-oxygenation with 100% oxygen Induction Type: IV induction, Rapid sequence and Cricoid Pressure applied Laryngoscope Size: Glidescope Grade View: Grade I Tube type: Oral Tube size: 7.5 mm Number of attempts: 1 Airway Equipment and Method: Stylet Placement Confirmation: ETT inserted through vocal cords under direct vision,  positive ETCO2 and breath sounds checked- equal and bilateral Secured at: 23 cm Tube secured with: Tape Dental Injury: Teeth and Oropharynx as per pre-operative assessment
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
## 2020-08-18 NOTE — Plan of Care (Signed)

## 2020-08-18 NOTE — Transfer of Care (Signed)
Immediate Anesthesia Transfer of Care Note  Patient: Colin Thompson  Procedure(s) Performed: EXPLORATORY LAPAROTOMY (N/A ) GASTROSTOMY TIMES TWO (Abdomen) REPAIR OF DIAPHRAGM (N/A Abdomen) CHEST TUBE INSERTION (Left Chest)  Patient Location: PACU  Anesthesia Type:General  Level of Consciousness: sedated  Airway & Oxygen Therapy: Patient Spontanous Breathing  Post-op Assessment: Report given to RN and Post -op Vital signs reviewed and stable  Post vital signs: Reviewed and stable  Last Vitals:  Vitals Value Taken Time  BP    Temp    Pulse 103 08/18/20 0229  Resp 32 08/18/20 0229  SpO2 93 % 08/18/20 0229  Vitals shown include unvalidated device data.  Last Pain:  Vitals:   08/17/20 2302  TempSrc: Oral  PainSc:          Complications: No complications documented.

## 2020-08-19 ENCOUNTER — Inpatient Hospital Stay (HOSPITAL_COMMUNITY): Payer: Self-pay

## 2020-08-19 LAB — BASIC METABOLIC PANEL
Anion gap: 10 (ref 5–15)
BUN: 9 mg/dL (ref 6–20)
CO2: 22 mmol/L (ref 22–32)
Calcium: 8.8 mg/dL — ABNORMAL LOW (ref 8.9–10.3)
Chloride: 103 mmol/L (ref 98–111)
Creatinine, Ser: 1.04 mg/dL (ref 0.61–1.24)
GFR, Estimated: >60 mL/min (ref >60)
Glucose, Bld: 105 mg/dL — ABNORMAL HIGH (ref 70–99)
Potassium: 4.1 mmol/L (ref 3.5–5.1)
Sodium: 135 mmol/L (ref 135–145)

## 2020-08-19 LAB — CBC
HCT: 41.6 % (ref 39.0–52.0)
Hemoglobin: 14.3 g/dL (ref 13.0–17.0)
MCH: 31.8 pg (ref 26.0–34.0)
MCHC: 34.4 g/dL (ref 30.0–36.0)
MCV: 92.7 fL (ref 80.0–100.0)
Platelets: 233 10*3/uL (ref 150–400)
RBC: 4.49 MIL/uL (ref 4.22–5.81)
RDW: 12.9 % (ref 11.5–15.5)
WBC: 15 10*3/uL — ABNORMAL HIGH (ref 4.0–10.5)
nRBC: 0 % (ref 0.0–0.2)

## 2020-08-19 LAB — PHOSPHORUS: Phosphorus: 2.8 mg/dL (ref 2.5–4.6)

## 2020-08-19 LAB — MAGNESIUM: Magnesium: 1.8 mg/dL (ref 1.7–2.4)
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
## 2020-08-19 NOTE — Progress Notes (Signed)
Patient urinated with output of 750 cc clear yellow urine. RN bladder scanned to check for residual with a finding of 0. Brynda Rim, RN
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
## 2020-08-20 ENCOUNTER — Inpatient Hospital Stay (HOSPITAL_COMMUNITY): Payer: Self-pay

## 2020-08-20 LAB — CBC
HCT: 39 % (ref 39.0–52.0)
Hemoglobin: 13.4 g/dL (ref 13.0–17.0)
MCH: 31.9 pg (ref 26.0–34.0)
MCHC: 34.4 g/dL (ref 30.0–36.0)
MCV: 92.9 fL (ref 80.0–100.0)
Platelets: 236 10*3/uL (ref 150–400)
RBC: 4.2 MIL/uL — ABNORMAL LOW (ref 4.22–5.81)
RDW: 13 % (ref 11.5–15.5)
WBC: 12.6 10*3/uL — ABNORMAL HIGH (ref 4.0–10.5)
nRBC: 0 % (ref 0.0–0.2)

## 2020-08-20 LAB — BASIC METABOLIC PANEL
Anion gap: 9 (ref 5–15)
BUN: 9 mg/dL (ref 6–20)
CO2: 24 mmol/L (ref 22–32)
Calcium: 8.9 mg/dL (ref 8.9–10.3)
Chloride: 100 mmol/L (ref 98–111)
Creatinine, Ser: 1.09 mg/dL (ref 0.61–1.24)
GFR, Estimated: >60 mL/min (ref >60)
Glucose, Bld: 97 mg/dL (ref 70–99)
Potassium: 4 mmol/L (ref 3.5–5.1)
Sodium: 133 mmol/L — ABNORMAL LOW (ref 135–145)

## 2020-08-20 LAB — MAGNESIUM: Magnesium: 1.8 mg/dL (ref 1.7–2.4)

## 2020-08-20 MED ORDER — IOHEXOL 300 MG/ML  SOLN
150.0000 mL | Freq: Once | INTRAMUSCULAR | Status: AC | PRN
  Administered 2020-08-20: 14:00:00 76 mL

## 2020-08-20 MED ORDER — HYDROMORPHONE HCL 1 MG/ML IJ SOLN: 0.5000 mg | INTRAMUSCULAR | Status: DC | PRN

## 2020-08-20 MED ORDER — WHITE PETROLATUM EX OINT
TOPICAL_OINTMENT | CUTANEOUS | Status: AC
  Administered 2020-08-20: 0.2
  Filled 2020-08-20: qty 28.35

## 2020-08-20 MED ORDER — ACETAMINOPHEN 500 MG PO TABS
1000.0000 mg | ORAL_TABLET | Freq: Four times a day (QID) | ORAL | Status: DC
  Administered 2020-08-20 – 2020-08-21 (×4): 1000 mg via ORAL
  Filled 2020-08-20 (×6): qty 2

## 2020-08-20 MED ORDER — OXYCODONE HCL 5 MG/5ML PO SOLN: 5.0000 mg | ORAL | Status: DC | PRN

## 2020-08-20 MED ORDER — MAGNESIUM SULFATE 2 GM/50ML IV SOLN
2.0000 g | Freq: Once | INTRAVENOUS | Status: AC
  Administered 2020-08-20: 16:00:00 2 g via INTRAVENOUS
  Filled 2020-08-20: qty 50

## 2020-08-20 MED ORDER — METHOCARBAMOL 500 MG PO TABS
1000.0000 mg | ORAL_TABLET | Freq: Three times a day (TID) | ORAL | Status: DC
  Administered 2020-08-20 – 2020-08-21 (×4): 1000 mg via ORAL
  Filled 2020-08-20 (×5): qty 2

## 2020-08-20 MED ORDER — KETOROLAC TROMETHAMINE 15 MG/ML IJ SOLN
30.0000 mg | Freq: Four times a day (QID) | INTRAMUSCULAR | Status: DC
  Administered 2020-08-20 – 2020-08-22 (×7): 30 mg via INTRAVENOUS
  Filled 2020-08-20 (×7): qty 2
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
## 2020-08-20 NOTE — Plan of Care (Signed)
  Problem: Clinical Measurements: Goal: Respiratory complications will improve Outcome: Progressing Goal: Cardiovascular complication will be avoided Outcome: Progressing   
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
## 2020-08-21 ENCOUNTER — Inpatient Hospital Stay (HOSPITAL_COMMUNITY): Payer: Self-pay

## 2020-08-21 MED ORDER — POLYETHYLENE GLYCOL 3350 17 G PO PACK
17.0000 g | PACK | Freq: Every day | ORAL | Status: DC
  Filled 2020-08-21: qty 1
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
## 2020-08-22 MED ORDER — DOCUSATE SODIUM 100 MG PO CAPS: 100.0000 mg | ORAL_CAPSULE | Freq: Two times a day (BID) | ORAL | 0 refills | Status: DC

## 2020-08-22 MED ORDER — DOCUSATE SODIUM 100 MG PO CAPS: 100.0000 mg | ORAL_CAPSULE | Freq: Two times a day (BID) | ORAL | Status: DC

## 2020-08-22 MED ORDER — PANTOPRAZOLE SODIUM 40 MG PO TBEC: 40.0000 mg | DELAYED_RELEASE_TABLET | Freq: Every day | ORAL | 0 refills | Status: DC

## 2020-08-22 MED ORDER — DIPHENHYDRAMINE HCL 25 MG PO CAPS: 25.0000 mg | ORAL_CAPSULE | Freq: Four times a day (QID) | ORAL | Status: DC | PRN

## 2020-08-22 MED ORDER — ACETAMINOPHEN 500 MG PO TABS: 1000.0000 mg | ORAL_TABLET | Freq: Four times a day (QID) | ORAL | 0 refills | Status: DC | PRN

## 2020-08-22 MED ORDER — METHOCARBAMOL 500 MG PO TABS: 1000.0000 mg | ORAL_TABLET | Freq: Three times a day (TID) | ORAL | Status: DC | PRN

## 2020-08-22 MED ORDER — METHOCARBAMOL 500 MG PO TABS: 1000.0000 mg | ORAL_TABLET | Freq: Three times a day (TID) | ORAL | 0 refills | Status: DC | PRN

## 2020-08-22 MED ORDER — POLYETHYLENE GLYCOL 3350 17 G PO PACK: 17.0000 g | PACK | Freq: Every day | ORAL | 0 refills | Status: DC | PRN

## 2020-08-22 MED ORDER — OXYCODONE HCL 5 MG PO TABS: 5.0000 mg | ORAL_TABLET | Freq: Four times a day (QID) | ORAL | 0 refills | Status: DC | PRN

## 2020-08-22 NOTE — TOC Transition Note (Signed)
Transition of Care [facility]) - CM/SW Discharge Note   Patient Details  Name: Colin Thompson MRN: [id number] Date of Birth: 11-24-1990  [facility] CM/SW Contact:  Kallie Locks, RN Phone Number: [phone] 08/22/2020, 11:27 AM   Clinical Narrative:   Spoke with patient at bedside regarding transition needs. Discussed the need for [facility] letter. Discussed that most of his medications are over the counter. Provided GoodRx coupons for medications for the medications patient states plans on taking.  Therefore [facility] letter was not generated.   Denies having any home health or DME needs. Patient up walking around room independently.   Discussed all of the above notes with nursing as well.        Patient Goals and CMS Choice        Discharge Placement                       Discharge Plan and Services   Discharge Planning Services: CM Consult                                 Social Determinants of Health (SDOH) Interventions     Readmission Risk Interventions No flowsheet data found.   Raiford Noble, MSN, RN,BSN Inpatient [facility] Case Manager [phone]
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
## 2020-08-23 ENCOUNTER — Encounter (HOSPITAL_COMMUNITY): Payer: Self-pay | Admitting: Emergency Medicine

## 2020-12-13 ENCOUNTER — Other Ambulatory Visit: Payer: Self-pay

## 2020-12-13 ENCOUNTER — Emergency Department (HOSPITAL_COMMUNITY)
Admission: EM | Admit: 2020-12-13 | Discharge: 2020-12-14 | Disposition: A | Discharge status: Home / Self Care | Payer: Self-pay | Attending: Emergency Medicine | Admitting: Emergency Medicine

## 2020-12-13 ENCOUNTER — Encounter (HOSPITAL_COMMUNITY): Payer: Self-pay | Admitting: *Deleted

## 2020-12-13 DIAGNOSIS — R0981 Nasal congestion: Secondary | ICD-10-CM | POA: Insufficient documentation

## 2020-12-13 DIAGNOSIS — F17210 Nicotine dependence, cigarettes, uncomplicated: Secondary | ICD-10-CM | POA: Insufficient documentation

## 2020-12-13 DIAGNOSIS — Z20822 Contact with and (suspected) exposure to covid-19: Secondary | ICD-10-CM | POA: Insufficient documentation

## 2020-12-13 DIAGNOSIS — R519 Headache, unspecified: Secondary | ICD-10-CM | POA: Insufficient documentation

## 2020-12-13 LAB — RESP PANEL BY RT-PCR (FLU A&B, COVID) ARPGX2
Influenza A by PCR: NEGATIVE
Influenza B by PCR: NEGATIVE
SARS Coronavirus 2 by RT PCR: NEGATIVE

## 2020-12-13 MED ORDER — DEXAMETHASONE 4 MG PO TABS
10.0000 mg | ORAL_TABLET | Freq: Once | ORAL | Status: AC
  Administered 2020-12-13: 10 mg via ORAL
  Filled 2020-12-13: qty 3

## 2020-12-13 MED ORDER — KETOROLAC TROMETHAMINE 30 MG/ML IJ SOLN
30.0000 mg | Freq: Once | INTRAMUSCULAR | Status: AC
  Administered 2020-12-13: 30 mg via INTRAMUSCULAR
  Filled 2020-12-13: qty 1

## 2020-12-13 MED ORDER — LORATADINE 10 MG PO TABS
10.0000 mg | ORAL_TABLET | Freq: Every day | ORAL | 0 refills | Status: DC | PRN
Start: 2020-12-13 — End: 2023-08-06
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
## 2020-12-13 NOTE — ED Triage Notes (Signed)
Emergency Medicine Provider Triage Evaluation Note  Colin Thompson , a 30 y.o. male  was evaluated in triage.  Pt complains of headache for one week. Denies injuries. Reports rhinorrhea. Denies fevers, chills. Denies neuro deficits  Review of Systems  Positive: Headache, rhinorrhea Negative: Neuro deficits,trauma  Physical Exam  BP 131/82 (BP Location: Left Arm)   Pulse 76   Temp 98.5 F (36.9 C) (Oral)   Resp 18   SpO2 100%  Gen:   Awake, no distress   HEENT:  Atraumatic  Resp:  Normal effort  Cardiac:  Normal rate  Abd:   Nondistended, nontender  MSK:   Moves extremities without difficulty  Neuro:  Speech clear   Medical Decision Making  Medically screening exam initiated at 6:42 PM.  Appropriate orders placed.  Colin Thompson was informed that the remainder of the evaluation will be completed by another provider, this initial triage assessment does not replace that evaluation, and the importance of remaining in the ED until their evaluation is complete.  Clinical Impression   30 y/o m presenting with ha. No neuro deficits.   MSE was initiated and I personally evaluated the patient and placed orders (if any) at  [facility] PM on December 13, 2020.  The patient appears stable so that the remainder of the MSE may be completed by another provider.     Karrie Meres, [place] 12/13/20 1842

## 2020-12-13 NOTE — ED Triage Notes (Signed)
Pt reports having a headache x 1 week. Has not tried any otc meds. reports occ blurred vision and mild runny nose. No distress is noted at triage.
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
## 2023-04-03 ENCOUNTER — Encounter (HOSPITAL_COMMUNITY): Payer: Self-pay | Admitting: Emergency Medicine

## 2023-04-03 ENCOUNTER — Other Ambulatory Visit: Payer: Self-pay

## 2023-04-03 ENCOUNTER — Emergency Department (HOSPITAL_COMMUNITY)
Admission: EM | Admit: 2023-04-03 | Discharge: 2023-04-04 | Disposition: A | Discharge status: Home / Self Care | Payer: Self-pay | Attending: Emergency Medicine | Admitting: Emergency Medicine

## 2023-04-03 DIAGNOSIS — R519 Headache, unspecified: Secondary | ICD-10-CM | POA: Insufficient documentation

## 2023-04-03 DIAGNOSIS — F17210 Nicotine dependence, cigarettes, uncomplicated: Secondary | ICD-10-CM | POA: Insufficient documentation

## 2023-04-03 LAB — CBC
HCT: 45 % (ref 39.0–52.0)
Hemoglobin: 15.6 g/dL (ref 13.0–17.0)
MCH: 31.9 pg (ref 26.0–34.0)
MCHC: 34.7 g/dL (ref 30.0–36.0)
MCV: 92 fL (ref 80.0–100.0)
Platelets: 253 10*3/uL (ref 150–400)
RBC: 4.89 MIL/uL (ref 4.22–5.81)
RDW: 13.1 % (ref 11.5–15.5)
WBC: 6 10*3/uL (ref 4.0–10.5)
nRBC: 0 % (ref 0.0–0.2)

## 2023-04-03 LAB — COMPREHENSIVE METABOLIC PANEL
ALT: 20 U/L (ref 0–44)
AST: 20 U/L (ref 15–41)
Albumin: 4 g/dL (ref 3.5–5.0)
Alkaline Phosphatase: 49 U/L (ref 38–126)
Anion gap: 8 (ref 5–15)
BUN: 10 mg/dL (ref 6–20)
CO2: 24 mmol/L (ref 22–32)
Calcium: 8.9 mg/dL (ref 8.9–10.3)
Chloride: 103 mmol/L (ref 98–111)
Creatinine, Ser: 1.15 mg/dL (ref 0.61–1.24)
GFR, Estimated: >60 mL/min (ref >60)
Glucose, Bld: 104 mg/dL — ABNORMAL HIGH (ref 70–99)
Potassium: 3.7 mmol/L (ref 3.5–5.1)
Sodium: 135 mmol/L (ref 135–145)
Total Bilirubin: 1.1 mg/dL (ref 0.3–1.2)
Total Protein: 7.1 g/dL (ref 6.5–8.1)

## 2023-04-03 MED ORDER — PROCHLORPERAZINE EDISYLATE 10 MG/2ML IJ SOLN
10.0000 mg | Freq: Once | INTRAMUSCULAR | Status: AC
  Administered 2023-04-04: 10 mg via INTRAVENOUS
  Filled 2023-04-03: qty 2

## 2023-04-03 MED ORDER — KETOROLAC TROMETHAMINE 15 MG/ML IJ SOLN
15.0000 mg | Freq: Once | INTRAMUSCULAR | Status: AC
  Administered 2023-04-04: 15 mg via INTRAVENOUS
  Filled 2023-04-03: qty 1

## 2023-04-03 MED ORDER — SODIUM CHLORIDE 0.9 % IV BOLUS
1000.0000 mL | Freq: Once | INTRAVENOUS | Status: AC
  Administered 2023-04-04: 1000 mL via INTRAVENOUS

## 2023-04-03 MED ORDER — DIPHENHYDRAMINE HCL 50 MG/ML IJ SOLN
12.5000 mg | Freq: Once | INTRAMUSCULAR | Status: AC
  Administered 2023-04-04: 12.5 mg via INTRAVENOUS
  Filled 2023-04-03: qty 1

## 2023-04-03 NOTE — ED Triage Notes (Signed)
Pt presents with headache (x 3 years)  Pt states "it just feels normal now it happens so often"  No light sensitivity, no n/v, or sound sensitivity.  Pt reports pain began again this morning and is in the back of his head.

## 2023-04-04 ENCOUNTER — Encounter: Payer: Self-pay | Admitting: Neurology

## 2023-04-04 MED ORDER — DIPHENHYDRAMINE HCL 50 MG/ML IJ SOLN
25.0000 mg | Freq: Once | INTRAMUSCULAR | Status: DC
  Filled 2023-04-04: qty 1

## 2023-04-04 MED ORDER — DIPHENHYDRAMINE HCL 50 MG/ML IJ SOLN
50.0000 mg | Freq: Once | INTRAMUSCULAR | Status: AC
  Administered 2023-04-04: 50 mg via INTRAVENOUS

## 2023-04-04 NOTE — ED Provider Notes (Signed)
[place] EMERGENCY DEPARTMENT AT [facility] Provider Note   CSN: [id number] Arrival date & time: 04/03/23  1937     History  Chief Complaint  Patient presents with   Headache    Colin Thompson is a 32 y.o. male with past medical history significant for tobacco dependence presents to the ED complaining of a headache in the back of his head.  He states it began this morning and was gradual onset.  He reports he has had headaches off and on for the past 3 years, but has not followed up with a specialist.  Denies photophobia, visual disturbance, nausea, vomiting, light-headedness, dizziness, weakness, numbness, syncope.  He has not taken any OTC medications to help with his symptoms.  He reports he consumes alcohol sometimes to help with his headaches.        Home Medications Prior to Admission medications   Medication Sig Start Date End Date Taking? Authorizing Provider  acetaminophen (TYLENOL) 500 MG tablet Take 2 tablets (1,000 mg total) by mouth every 6 (six) hours as needed for mild pain. 08/22/20   Almond Lint, MD  docusate sodium (COLACE) 100 MG capsule Take 1 capsule (100 mg total) by mouth 2 (two) times daily. 08/22/20   Almond Lint, MD  loratadine (CLARITIN) 10 MG tablet Take 1 tablet (10 mg total) by mouth daily as needed for allergies. 12/13/20 01/12/21  Curatolo, Adam, DO  methocarbamol (ROBAXIN) 500 MG tablet Take 2 tablets (1,000 mg total) by mouth every 8 (eight) hours as needed for muscle spasms. 08/22/20   Almond Lint, MD  metroNIDAZOLE (FLAGYL) 500 MG tablet Take 1 tablet (500 mg total) by mouth 2 (two) times daily with a meal. DO NOT CONSUME ALCOHOL WHILE TAKING THIS MEDICATION. 07/09/19   Wallis Bamberg, PA-C  metroNIDAZOLE (FLAGYL) 500 MG tablet Take 1 tablet (500 mg total) by mouth 2 (two) times daily. 02/10/20   Lamptey, Britta Mccreedy, MD  oxyCODONE (OXY IR/ROXICODONE) 5 MG immediate release tablet Take 1-2 tablets (5-10 mg total) by mouth every 6 (six) hours  as needed for moderate pain, severe pain or breakthrough pain. 08/22/20   Almond Lint, MD  oxyCODONE (OXY IR/ROXICODONE) 5 MG immediate release tablet Take 1-2 tablets (5-10 mg total) by mouth every 6 (six) hours as needed for moderate pain, severe pain or breakthrough pain. 08/22/20   Almond Lint, MD  pantoprazole (PROTONIX) 40 MG tablet Take 1 tablet (40 mg total) by mouth daily. 08/22/20   Almond Lint, MD  polyethylene glycol (MIRALAX / GLYCOLAX) 17 g packet Take 17 g by mouth daily as needed for moderate constipation. 08/22/20   Almond Lint, MD      Allergies    Patient has no known allergies.    Review of Systems   Review of Systems  Eyes:  Negative for photophobia and visual disturbance.  Gastrointestinal:  Negative for nausea and vomiting.  Neurological:  Positive for headaches. Negative for dizziness, syncope, weakness, light-headedness and numbness.    Physical Exam Updated Vital Signs BP 129/65 (BP Location: Left Arm)   Pulse 68   Temp 98.3 F (36.8 C)   Resp 18   SpO2 100%  Physical Exam Vitals and nursing note reviewed.  Constitutional:      General: He is not in acute distress.    Appearance: Normal appearance. He is not ill-appearing or diaphoretic.  Eyes:     General: Lids are normal. Vision grossly intact.     Extraocular Movements: Extraocular movements intact.  Conjunctiva/sclera: Conjunctivae normal.  Cardiovascular:     Rate and Rhythm: Normal rate and regular rhythm.  Pulmonary:     Effort: Pulmonary effort is normal.  Musculoskeletal:     Cervical back: Full passive range of motion without pain.  Skin:    General: Skin is warm and dry.     Capillary Refill: Capillary refill takes less than 2 seconds.  Neurological:     General: No focal deficit present.     Mental Status: He is alert. Mental status is at baseline.     GCS: GCS eye subscore is 4. GCS verbal subscore is 5. GCS motor subscore is 6.     Cranial Nerves: Cranial nerves 2-12 are  intact. No cranial nerve deficit.     Sensory: Sensation is intact.     Motor: Motor function is intact.     Coordination: Coordination is intact.     Gait: Gait is intact.  Psychiatric:        Mood and Affect: Mood normal.        Behavior: Behavior normal.     ED Results / Procedures / Treatments   Labs (all labs ordered are listed, but only abnormal results are displayed) Labs Reviewed  COMPREHENSIVE METABOLIC PANEL - Abnormal; Notable for the following components:      Result Value   Glucose, Bld 104 (*)    All other components within normal limits  CBC    EKG None  Radiology No results found.  Procedures Procedures    Medications Ordered in ED Medications  sodium chloride 0.9 % bolus 1,000 mL (1,000 mLs Intravenous New Bag/Given 04/04/23 0018)  ketorolac (TORADOL) 15 MG/ML injection 15 mg (15 mg Intravenous Given 04/04/23 0013)  prochlorperazine (COMPAZINE) injection 10 mg (10 mg Intravenous Given 04/04/23 0014)  diphenhydrAMINE (BENADRYL) injection 12.5 mg (12.5 mg Intravenous Given 04/04/23 0012)  diphenhydrAMINE (BENADRYL) injection 50 mg (50 mg Intravenous Given 04/04/23 0034)    ED Course/ Medical Decision Making/ A&P                                 Medical Decision Making Amount and/or Complexity of Data Reviewed Labs: ordered.  Risk Prescription drug management.   This patient presents to the ED with chief complaint(s) of headache with pertinent past medical history of tobacco dependence.  The complaint involves an extensive differential diagnosis and also carries with it a high risk of complications and morbidity.    The differential diagnosis includes migraine headache, tension headache, cluster headache   Initial Assessment:   On exam, patient is lying in bed and does not appear to be in acute distress.  Normal cervical ROM without pain, no meningismus.  Neuro exam is overall reassuring.  EOM intact.  PERRL.  He moves all extremities appropriately.   Speech is clear.    Treatment and Reassessment: Patient given migraine cocktail including IV fluids, toradol, benadryl and compazine.  Patient felt extremely dizzy and disoriented following compazine.  He was given an additional 50 mg of benadryl with improvement in symptoms.  Patient is resting comfortably on reassessment and has improvement in headache.  He was able to ambulate to the bathroom without difficulty.    Disposition:   Patient evaluated and treated in ED for bad headache.  Patient has been having headaches for some time, will refer to neurology for outpatient follow-up.  Recommended ibuprofen for headache management at home.  The patient  has been appropriately medically screened and/or stabilized in the ED. I have low suspicion for any other emergent medical condition which would require further screening, evaluation or treatment in the ED or require inpatient management. At time of discharge the patient is hemodynamically stable and in no acute distress. I have discussed work-up results and diagnosis with patient and answered all questions. Patient is agreeable with discharge plan. We discussed strict return precautions for returning to the emergency department and they verbalized understanding.    Social Determinants of Health:   Patient's  tobacco use  increases the complexity of managing their presentation         Final Clinical Impression(s) / ED Diagnoses Final diagnoses:  Bad headache    Rx / DC Orders ED Discharge Orders     None         Lenard Simmer, PA-C 04/04/23 0253    Sabas Sous, MD 04/04/23 [phone]

## 2023-04-04 NOTE — Discharge Instructions (Addendum)
Thank you for allowing [place] to be a part of your care today.  You were evaluated in the ED for a headache.   You were given a combination of medications used to treat headaches.  I recommend at home taking 400-800 mg of ibuprofen every 6-8 hours as needed.  DO NOT exceed 3200 mg of ibuprofen in a 24-hour period.  You may take ibuprofen chewable tablets.   I have provided information for neurology follow-up.  Please call their office to schedule an appointment.   Return to the ED if you develop sudden worsening of your symptoms or if you have any new concerns.

## 2023-05-13 ENCOUNTER — Emergency Department (HOSPITAL_BASED_OUTPATIENT_CLINIC_OR_DEPARTMENT_OTHER)
Admission: EM | Admit: 2023-05-13 | Discharge: 2023-05-14 | Disposition: A | Discharge status: Home / Self Care | Payer: Self-pay | Attending: Emergency Medicine | Admitting: Emergency Medicine

## 2023-05-13 ENCOUNTER — Encounter (HOSPITAL_BASED_OUTPATIENT_CLINIC_OR_DEPARTMENT_OTHER): Payer: Self-pay

## 2023-05-13 DIAGNOSIS — H538 Other visual disturbances: Secondary | ICD-10-CM | POA: Insufficient documentation

## 2023-05-13 DIAGNOSIS — R112 Nausea with vomiting, unspecified: Secondary | ICD-10-CM | POA: Insufficient documentation

## 2023-05-13 DIAGNOSIS — R519 Headache, unspecified: Secondary | ICD-10-CM | POA: Insufficient documentation

## 2023-05-13 MED ORDER — MAGNESIUM SULFATE IN D5W 1-5 GM/100ML-% IV SOLN
1.0000 g | Freq: Once | INTRAVENOUS | Status: AC
  Administered 2023-05-14: 1 g via INTRAVENOUS
  Filled 2023-05-13: qty 100

## 2023-05-13 MED ORDER — KETOROLAC TROMETHAMINE 15 MG/ML IJ SOLN
30.0000 mg | Freq: Once | INTRAMUSCULAR | Status: AC
  Administered 2023-05-14: 30 mg via INTRAVENOUS
  Filled 2023-05-13: qty 2

## 2023-05-13 MED ORDER — DEXAMETHASONE SODIUM PHOSPHATE 10 MG/ML IJ SOLN
10.0000 mg | Freq: Once | INTRAMUSCULAR | Status: AC
  Administered 2023-05-14: 10 mg via INTRAVENOUS
  Filled 2023-05-13: qty 1

## 2023-05-13 MED ORDER — SODIUM CHLORIDE 0.9 % IV BOLUS
500.0000 mL | Freq: Once | INTRAVENOUS | Status: AC
  Administered 2023-05-14: 500 mL via INTRAVENOUS

## 2023-05-13 NOTE — ED Provider Notes (Signed)
[place] EMERGENCY DEPARTMENT AT [facility] Provider Note   CSN: [id number] Arrival date & time: 05/13/23  2255     History {Add pertinent medical, surgical, social history, OB history to HPI:1} Chief Complaint  Patient presents with   Migraine    FLORENCE MUSIC is a 32 y.o. male.  He has no significant past medical history.  He is here with a complaint of a headache it has been bothering him all day involving his whole head associated with some burning of his scalp.  Causing nausea and vomiting.  He has been getting daily headaches for the last few months.  He is waiting for neurology appointment in the next month or 2.  Using over-the-counter medications with minimal relief.  He denies any significant head trauma in the past.  Headache today is similar to other ones he has been having.  The history is provided by the patient and a relative.  Headache Pain location:  Generalized Severity currently:  7/10 Severity at highest:  7/10 Onset quality:  Gradual Duration: 3 months. Progression:  Unchanged Relieved by:  Nothing Worsened by:  Light and activity Ineffective treatments: OTC meds. Associated symptoms: blurred vision, nausea and vomiting   Associated symptoms: no abdominal pain, no fever, no focal weakness and no neck pain        Home Medications Prior to Admission medications   Medication Sig Start Date End Date Taking? Authorizing Provider  acetaminophen (TYLENOL) 500 MG tablet Take 2 tablets (1,000 mg total) by mouth every 6 (six) hours as needed for mild pain. 08/22/20   Almond Lint, MD  docusate sodium (COLACE) 100 MG capsule Take 1 capsule (100 mg total) by mouth 2 (two) times daily. 08/22/20   Almond Lint, MD  loratadine (CLARITIN) 10 MG tablet Take 1 tablet (10 mg total) by mouth daily as needed for allergies. 12/13/20 01/12/21  Curatolo, Adam, DO  methocarbamol (ROBAXIN) 500 MG tablet Take 2 tablets (1,000 mg total) by mouth every 8 (eight) hours as  needed for muscle spasms. 08/22/20   Almond Lint, MD  metroNIDAZOLE (FLAGYL) 500 MG tablet Take 1 tablet (500 mg total) by mouth 2 (two) times daily with a meal. DO NOT CONSUME ALCOHOL WHILE TAKING THIS MEDICATION. 07/09/19   Wallis Bamberg, PA-C  metroNIDAZOLE (FLAGYL) 500 MG tablet Take 1 tablet (500 mg total) by mouth 2 (two) times daily. 02/10/20   Lamptey, Britta Mccreedy, MD  oxyCODONE (OXY IR/ROXICODONE) 5 MG immediate release tablet Take 1-2 tablets (5-10 mg total) by mouth every 6 (six) hours as needed for moderate pain, severe pain or breakthrough pain. 08/22/20   Almond Lint, MD  oxyCODONE (OXY IR/ROXICODONE) 5 MG immediate release tablet Take 1-2 tablets (5-10 mg total) by mouth every 6 (six) hours as needed for moderate pain, severe pain or breakthrough pain. 08/22/20   Almond Lint, MD  pantoprazole (PROTONIX) 40 MG tablet Take 1 tablet (40 mg total) by mouth daily. 08/22/20   Almond Lint, MD  polyethylene glycol (MIRALAX / GLYCOLAX) 17 g packet Take 17 g by mouth daily as needed for moderate constipation. 08/22/20   Almond Lint, MD      Allergies    Patient has no known allergies.    Review of Systems   Review of Systems  Constitutional:  Negative for fever.  Eyes:  Positive for blurred vision and visual disturbance.  Respiratory:  Negative for shortness of breath.   Cardiovascular:  Negative for chest pain.  Gastrointestinal:  Positive for nausea  and vomiting. Negative for abdominal pain.  Musculoskeletal:  Negative for neck pain.  Neurological:  Positive for headaches. Negative for focal weakness.    Physical Exam Updated Vital Signs BP 137/68 (BP Location: Right Arm)   Pulse 65   Temp 98.3 F (36.8 C)   Resp 15   Ht 6' (1.829 m)   Wt 107 kg   SpO2 97%   BMI 32.01 kg/m  Physical Exam Vitals and nursing note reviewed.  Constitutional:      General: He is not in acute distress.    Appearance: Normal appearance. He is well-developed.  HENT:     Head: Normocephalic  and atraumatic.  Eyes:     Conjunctiva/sclera: Conjunctivae normal.  Cardiovascular:     Rate and Rhythm: Normal rate and regular rhythm.     Heart sounds: No murmur heard. Pulmonary:     Effort: Pulmonary effort is normal. No respiratory distress.     Breath sounds: Normal breath sounds.  Abdominal:     Palpations: Abdomen is soft.     Tenderness: There is no abdominal tenderness.  Musculoskeletal:        General: No deformity.     Cervical back: Neck supple.  Skin:    General: Skin is warm and dry.     Capillary Refill: Capillary refill takes less than 2 seconds.  Neurological:     General: No focal deficit present.     Mental Status: He is alert.     Sensory: No sensory deficit.     Motor: No weakness.     ED Results / Procedures / Treatments   Labs (all labs ordered are listed, but only abnormal results are displayed) Labs Reviewed - No data to display  EKG None  Radiology No results found.  Procedures Procedures  {Document cardiac monitor, telemetry assessment procedure when appropriate:1}  Medications Ordered in ED Medications  ketorolac (TORADOL) 15 MG/ML injection 30 mg (has no administration in time range)  dexamethasone (DECADRON) injection 10 mg (has no administration in time range)  magnesium sulfate IVPB 1 g 100 mL (has no administration in time range)  sodium chloride 0.9 % bolus 500 mL (has no administration in time range)    ED Course/ Medical Decision Making/ A&P   {   Click here for ABCD2, HEART and other calculatorsREFRESH Note before signing :1}                              Medical Decision Making Amount and/or Complexity of Data Reviewed Labs: ordered. Radiology: ordered.  Risk Prescription drug management.   This patient complains of ***; this involves an extensive number of treatment Options and is a complaint that carries with it a high risk of complications and morbidity. The differential includes ***  I ordered, reviewed and  interpreted labs, which included *** I ordered medication *** and reviewed PMP when indicated. I ordered imaging studies which included *** and I independently    visualized and interpreted imaging which showed *** Additional history obtained from *** Previous records obtained and reviewed *** I consulted *** and discussed lab and imaging findings and discussed disposition.  Cardiac monitoring reviewed, *** Social determinants considered, *** Critical Interventions: ***  After the interventions stated above, I reevaluated the patient and found *** Admission and further testing considered, ***   {Document critical care time when appropriate:1} {Document review of labs and clinical decision tools ie heart score, Chads2Vasc2 etc:1}  {  Document your independent review of radiology images, and any outside records:1} {Document your discussion with family members, caretakers, and with consultants:1} {Document social determinants of health affecting pt's care:1} {Document your decision making why or why not admission, treatments were needed:1} Final Clinical Impression(s) / ED Diagnoses Final diagnoses:  None    Rx / DC Orders ED Discharge Orders     None

## 2023-05-13 NOTE — ED Triage Notes (Signed)
Pt c/o migraine started earlier today +N/V

## 2023-05-14 ENCOUNTER — Emergency Department (HOSPITAL_BASED_OUTPATIENT_CLINIC_OR_DEPARTMENT_OTHER): Payer: Self-pay

## 2023-05-14 LAB — BASIC METABOLIC PANEL
Anion gap: 7 (ref 5–15)
BUN: 12 mg/dL (ref 6–20)
CO2: 27 mmol/L (ref 22–32)
Calcium: 8.9 mg/dL (ref 8.9–10.3)
Chloride: 104 mmol/L (ref 98–111)
Creatinine, Ser: 0.98 mg/dL (ref 0.61–1.24)
GFR, Estimated: >60 mL/min (ref >60)
Glucose, Bld: 94 mg/dL (ref 70–99)
Potassium: 3.6 mmol/L (ref 3.5–5.1)
Sodium: 138 mmol/L (ref 135–145)

## 2023-05-14 LAB — CBC WITH DIFFERENTIAL/PLATELET
Abs Immature Granulocytes: 0.01 10*3/uL (ref 0.00–0.07)
Basophils Absolute: 0 10*3/uL (ref 0.0–0.1)
Basophils Relative: 1 %
Eosinophils Absolute: 0.1 10*3/uL (ref 0.0–0.5)
Eosinophils Relative: 1 %
HCT: 41.8 % (ref 39.0–52.0)
Hemoglobin: 14.6 g/dL (ref 13.0–17.0)
Immature Granulocytes: 0 %
Lymphocytes Relative: 51 %
Lymphs Abs: 3.5 10*3/uL (ref 0.7–4.0)
MCH: 32 pg (ref 26.0–34.0)
MCHC: 34.9 g/dL (ref 30.0–36.0)
MCV: 91.7 fL (ref 80.0–100.0)
Monocytes Absolute: 0.5 10*3/uL (ref 0.1–1.0)
Monocytes Relative: 7 %
Neutro Abs: 2.7 10*3/uL (ref 1.7–7.7)
Neutrophils Relative %: 40 %
Platelets: 227 10*3/uL (ref 150–400)
RBC: 4.56 MIL/uL (ref 4.22–5.81)
RDW: 12.7 % (ref 11.5–15.5)
WBC: 6.8 10*3/uL (ref 4.0–10.5)
nRBC: 0 % (ref 0.0–0.2)

## 2023-05-14 MED ORDER — BUTALBITAL-APAP-CAFFEINE 50-325-40 MG PO TABS: 1.0000 | ORAL_TABLET | Freq: Four times a day (QID) | ORAL | 0 refills | Status: DC | PRN

## 2023-05-14 NOTE — Discharge Instructions (Signed)
You were seen in the emergency department for continued headache for months.  You had blood work and a CAT scan that did not show any obvious explanation for your symptoms.  We sent a prescription for some medication to try to see if it would help with your headache.  You can also use ibuprofen.  Follow-up with a neurologist as scheduled.  Return if any worsening or concerning symptoms

## 2023-08-03 NOTE — Progress Notes (Unsigned)
NEUROLOGY CONSULTATION NOTE  Colin Thompson MRN: [id number] DOB: Oct 13, 1990  Referring provider: Kennis Carina, MD (ED referral) Primary care provider: No PCP  Reason for consult:  headache  Assessment/Plan:   Chronic tension-type headache, intractable Bilateral occipital neuralgia Cervicalgia   Start amitriptyline 10mg  at bedtime.  We can increase dose in 4 weeks to 25mg  if needed. May use Excedrin as needed, limited to no more than 10 days out of the month. Limit use of pain relievers to no more than 2 days out of week to prevent risk of rebound or medication-overuse headache. Lifestyle modification:  discussed increase hydration, sleep hygiene, routine exercise, stress reduction, smoking cessation As he has a daily persistent headache for several months, I would like to get MRI/MRA of head.  He is without insurance.  Advised to apply for the [facility] Financial Assistance Program Follow up 5 months.   Subjective:  Colin Thompson is a 32 year old right-handed male with tobacco use disorder who presents for headaches.  History supplemented by ED notes.  Onset:  2021.  Initially 1 to 2 times a month.  They have been daily since around August Location:  diffuse Quality:  throbbing Intensity:  3/10.  She denies new headache, thunderclap headache or severe headache that wakes him from sleep. Aura:  absent Prodrome:  absent Associated symptoms:  Dizziness, sometimes blurred vision, worse when coughing.  He denies associated nausea, photophobia, phonophobia, unilateral numbness or weakness. Duration:  all day Frequency:  daily Frequency of abortive medication: nothing Triggers:  unknown, but appears to be correlation with anxiety/stress Relieving factors:  laying down, alcohol, shower Activity:  aggravates but able to function  Sometimes has burning sensation over the scalp in the back of his head.  Sometimes neck pain.    Past NSAIDS/analgesics:  none Past abortive triptans:   none Past abortive ergotamine:  none Past muscle relaxants:  none Past anti-emetic:  none Past antihypertensive medications:  none Past antidepressant medications:  none Past anticonvulsant medications:  none Past anti-CGRP:  none Other past therapies:  none  Current NSAIDS/analgesics:  none Current triptans:  none Current ergotamine:  none Current anti-emetic:  none Current muscle relaxants:  none Current Antihypertensive medications:  none Current Antidepressant medications:  none Current Anticonvulsant medications:  none Current anti-CGRP:  none Current Vitamins/Herbal/Supplements:  none Current Antihistamines/Decongestants:  none   Caffeine:  none Alcohol:  once every 2 drinks Smoker:  Marijuana.  Former cigarette smoker Diet:  does not regularly keep hydrated.  Eats pizza, no vegetables Exercise:  no Depression:  no; Anxiety:  yes Sleep hygiene:  2-3 hours of sleep a night.  Will play video games.  Trouble falling asleep.  Thumping pain keeps him up Family history of headache:  Maternal aunt and maternal grandfather had migraines      PAST MEDICAL HISTORY: No past medical history on file.  PAST SURGICAL HISTORY: Past Surgical History:  Procedure Laterality Date   CHEST TUBE INSERTION Left 08/17/2020   Procedure: CHEST TUBE INSERTION;  Surgeon: Violeta Gelinas, MD;  Location: [facility] OR;  Service: General;  Laterality: Left;   CLOSURE OF DIAPHRAGM N/A 08/17/2020   Procedure: REPAIR OF DIAPHRAGM;  Surgeon: Violeta Gelinas, MD;  Location: [facility] OR;  Service: General;  Laterality: N/A;   FASCIOTOMY CLOSURE Left 08/13/2013   Procedure: FASCIOTOMY CLOSURE;  Surgeon: Sherren Kerns, MD;  Location: [facility] OR;  Service: Vascular;  Laterality: Left;   FEMORAL-POPLITEAL BYPASS GRAFT Left 08/11/2013   Procedure: Repair of Left  Popliteal Artery using Left Above Knee Popliteal to Above Knee Popliteal Bypass Graft with interpositional contralateral reverse vein graft; fourth compartment  fasciotomy;  Surgeon: Sherren Kerns, MD;  Location: [facility] OR;  Service: Vascular;  Laterality: Left;   GASTROSTOMY  08/17/2020   Procedure: GASTROSTOMY TIMES TWO;  Surgeon: Violeta Gelinas, MD;  Location: [facility] OR;  Service: General;;   INTRAOPERATIVE ARTERIOGRAM Left 08/11/2013   Procedure: INTRA OPERATIVE ARTERIOGRAM;  Surgeon: Sherren Kerns, MD;  Location: [facility] OR;  Service: Vascular;  Laterality: Left;   LAPAROTOMY N/A 08/17/2020   Procedure: EXPLORATORY LAPAROTOMY;  Surgeon: Violeta Gelinas, MD;  Location: [facility] OR;  Service: General;  Laterality: N/A;    MEDICATIONS: Current Outpatient Medications on File Prior to Visit  Medication Sig Dispense Refill   acetaminophen (TYLENOL) 500 MG tablet Take 2 tablets (1,000 mg total) by mouth every 6 (six) hours as needed for mild pain. 100 tablet 0   butalbital-acetaminophen-caffeine (FIORICET) 50-325-40 MG tablet Take 1-2 tablets by mouth every 6 (six) hours as needed for headache. 20 tablet 0   docusate sodium (COLACE) 100 MG capsule Take 1 capsule (100 mg total) by mouth 2 (two) times daily. 20 capsule 0   loratadine (CLARITIN) 10 MG tablet Take 1 tablet (10 mg total) by mouth daily as needed for allergies. 30 tablet 0   methocarbamol (ROBAXIN) 500 MG tablet Take 2 tablets (1,000 mg total) by mouth every 8 (eight) hours as needed for muscle spasms. 20 tablet 0   metroNIDAZOLE (FLAGYL) 500 MG tablet Take 1 tablet (500 mg total) by mouth 2 (two) times daily with a meal. DO NOT CONSUME ALCOHOL WHILE TAKING THIS MEDICATION. 14 tablet 0   metroNIDAZOLE (FLAGYL) 500 MG tablet Take 1 tablet (500 mg total) by mouth 2 (two) times daily. 14 tablet 0   oxyCODONE (OXY IR/ROXICODONE) 5 MG immediate release tablet Take 1-2 tablets (5-10 mg total) by mouth every 6 (six) hours as needed for moderate pain, severe pain or breakthrough pain. 30 tablet 0   oxyCODONE (OXY IR/ROXICODONE) 5 MG immediate release tablet Take 1-2 tablets (5-10 mg total) by mouth every 6 (six)  hours as needed for moderate pain, severe pain or breakthrough pain. 30 tablet 0   pantoprazole (PROTONIX) 40 MG tablet Take 1 tablet (40 mg total) by mouth daily. 30 tablet 0   polyethylene glycol (MIRALAX / GLYCOLAX) 17 g packet Take 17 g by mouth daily as needed for moderate constipation. 14 each 0   No current facility-administered medications on file prior to visit.    ALLERGIES: No Known Allergies  FAMILY HISTORY: Family History  Problem Relation Age of Onset   Healthy Mother    Healthy Father     Objective:  Blood pressure 120/81, pulse 74, height 6' (1.829 m), weight 247 lb (112 kg), SpO2 100%. General: No acute distress.  Patient appears well-groomed.   Head:  Normocephalic/atraumatic Eyes:  fundi examined but not visualized Neck: supple, bilateral paraspinal tenderness, full range of motion Heart: regular rate and rhythm Neurological Exam: Mental status: alert and oriented to person, place, and time, speech fluent and not dysarthric, language intact. Cranial nerves: CN I: not tested CN II: pupils equal, round and reactive to light, visual fields intact CN III, IV, VI:  full range of motion, no nystagmus, no ptosis CN V: facial sensation intact. CN VII: upper and lower face symmetric CN VIII: hearing intact CN IX, X: gag intact, uvula midline CN XI: sternocleidomastoid and trapezius muscles intact CN XII:  tongue midline Bulk & Tone: normal, no fasciculations. Motor:  muscle strength 5/5 throughout Sensation:  Pinprick and vibratory sensation intact. Deep Tendon Reflexes:  2+ throughout,  toes downgoing.   Finger to nose testing:  Without dysmetria.   Gait:  Normal station and stride.  Romberg negative.    Thank you for allowing me to take part in the care of this patient.  Shon Millet, DO

## 2023-08-06 ENCOUNTER — Ambulatory Visit (INDEPENDENT_AMBULATORY_CARE_PROVIDER_SITE_OTHER): Payer: Self-pay | Admitting: Neurology

## 2023-08-06 ENCOUNTER — Encounter: Payer: Self-pay | Admitting: Neurology

## 2023-08-06 VITALS — BP 120/81 | HR 74 | Ht 72.0 in | Wt 247.0 lb

## 2023-08-06 DIAGNOSIS — G44221 Chronic tension-type headache, intractable: Secondary | ICD-10-CM

## 2023-08-06 DIAGNOSIS — M5481 Occipital neuralgia: Secondary | ICD-10-CM

## 2023-08-06 DIAGNOSIS — M542 Cervicalgia: Secondary | ICD-10-CM

## 2023-08-06 MED ORDER — AMITRIPTYLINE HCL 10 MG PO TABS: 10.0000 mg | ORAL_TABLET | Freq: Every day | ORAL | 5 refills | Status: AC

## 2023-08-06 NOTE — Patient Instructions (Addendum)
Help finding a primary care provider within [place]. If you do not have access to a computer or the Internet you can call [phone] and  they will help you scheduled with a provider.  Or you can go to: [email or web address]     Start amitriptyline. If no improvement in 4 weeks, contact me and we can increase dose. May use Excedrin/generic.  Limit use to no more than 10 days out of month to prevent rebound headache Sign up for [facility].  Then when you have it, contact me and we can order an MRI of the brain. Increase water intake Routine exercise Follow up 5 months.

## 2023-11-01 ENCOUNTER — Other Ambulatory Visit: Payer: Self-pay

## 2023-11-01 ENCOUNTER — Emergency Department (HOSPITAL_BASED_OUTPATIENT_CLINIC_OR_DEPARTMENT_OTHER)
Admission: EM | Admit: 2023-11-01 | Discharge: 2023-11-01 | Disposition: A | Discharge status: Home / Self Care | Payer: Self-pay | Attending: Emergency Medicine | Admitting: Emergency Medicine

## 2023-11-01 ENCOUNTER — Encounter (HOSPITAL_BASED_OUTPATIENT_CLINIC_OR_DEPARTMENT_OTHER): Payer: Self-pay

## 2023-11-01 DIAGNOSIS — L84 Corns and callosities: Secondary | ICD-10-CM | POA: Insufficient documentation

## 2023-11-01 MED ORDER — DOXYCYCLINE HYCLATE 100 MG PO CAPS: 100.0000 mg | ORAL_CAPSULE | Freq: Two times a day (BID) | ORAL | 0 refills | Status: AC

## 2023-11-01 NOTE — ED Provider Notes (Signed)
 [place] [facility]MENT AT [facility] Provider Note   CSN: [id number] Arrival date & time: 11/01/23  0351     History  Chief Complaint  Patient presents with   Foot Pain    Colin Thompson is a 33 y.o. male.  33 yo M with a chief complaints of a painful spot on his right foot.  Patient tells me that he got a splinter into his foot and it been there for almost a month now and then he thinks it is formed into this area of infection.  Started bothering him a couple days ago.  Worse with bearing weight.   Foot Pain       Home Medications Prior to Admission medications   Medication Sig Start Date End Date Taking? Authorizing Provider  doxycycline (VIBRAMYCIN) 100 MG capsule Take 1 capsule (100 mg total) by mouth 2 (two) times daily. One po bid x 7 days 11/01/23  Yes Melene Plan, DO  amitriptyline (ELAVIL) 10 MG tablet Take 1 tablet (10 mg total) by mouth at bedtime. 08/06/23   Drema Dallas, DO      Allergies    Patient has no known allergies.    Review of Systems   Review of Systems  Physical Exam Updated Vital Signs BP 128/69   Pulse 65   Temp 97.8 F (36.6 C) (Oral)   Resp 16   SpO2 98%  Physical Exam Vitals and nursing note reviewed.  Constitutional:      Appearance: He is well-developed.  HENT:     Head: Normocephalic and atraumatic.  Eyes:     Pupils: Pupils are equal, round, and reactive to light.  Neck:     Vascular: No JVD.  Cardiovascular:     Rate and Rhythm: Normal rate and regular rhythm.     Heart sounds: No murmur heard.    No friction rub. No gallop.  Pulmonary:     Effort: No respiratory distress.     Breath sounds: No wheezing.  Abdominal:     General: There is no distension.     Tenderness: There is no abdominal tenderness. There is no guarding or rebound.  Musculoskeletal:        General: Normal range of motion.     Cervical back: Normal range of motion and neck supple.     Comments: Keratotic lesion about the right  instep.  No obvious surrounding induration or erythema or fluctuance  Skin:    Coloration: Skin is not pale.     Findings: No rash.  Neurological:     Mental Status: He is alert and oriented to person, place, and time.  Psychiatric:        Behavior: Behavior normal.     ED Results / Procedures / Treatments   Labs (all labs ordered are listed, but only abnormal results are displayed) Labs Reviewed - No data to display  EKG None  Radiology No results found.  Procedures Procedures    Medications Ordered in ED Medications - No data to display  ED Course/ Medical Decision Making/ A&P                                 Medical Decision Making Risk Prescription drug management.   33 yo M with a cc of a painful spot to the bottom of his foot.  To me it looks like the patient has formed a corn though he tells  me by history that he had actually had a splinter there and thinks that this formed around it.  I did offer to try to perform an I&D to try to see if I could remove a foreign body however he is declining.  Will start on oral antibiotics.  I recommended he try a corn removal products at the pharmacy.  Marland Kitchen4:17 AM:  I have discussed the diagnosis/risks/treatment options with the patient.  Evaluation and diagnostic testing in the emergency department does not suggest an emergent condition requiring admission or immediate intervention beyond what has been performed at this time.  They will follow up with PCP. We also discussed returning to the ED immediately if new or worsening sx occur. We discussed the sx which are most concerning (e.g., sudden worsening pain, fever, inability to tolerate by mouth, spreading redness) that necessitate immediate return. Medications administered to the patient during their visit and any new prescriptions provided to the patient are listed below.  Medications given during this visit Medications - No data to display   The patient appears reasonably  screen and/or stabilized for discharge and I doubt any other medical condition or other [facility] requiring further screening, evaluation, or treatment in the ED at this time prior to discharge.          Final Clinical Impression(s) / ED Diagnoses Final diagnoses:  Corn of foot    Rx / DC Orders ED Discharge Orders          Ordered    doxycycline (VIBRAMYCIN) 100 MG capsule  2 times daily        11/01/23 0413              Melene Plan, DO 11/01/23 [phone]

## 2023-11-01 NOTE — ED Triage Notes (Signed)
 Pt presents via POV c/o pain to right foot. Reports had a splinter in foot x3 weeks and reports now starting to have pain. Ambulatory to triage.

## 2023-11-01 NOTE — Discharge Instructions (Signed)
 Go to the drugstore and get a corn removal products.  This typically will help dissolve the outside layer of the skin and will help this to resolve.  I started you on antibiotics in case this is an infection.  Please follow-up with your doctor in the office.
